# Patient Record
Sex: Female | Born: 1985 | Race: White | Hispanic: No | Marital: Single | Smoking: Never smoker
Health system: Southern US, Community
[De-identification: ages and names within clinical notes are randomized; demographics above are authoritative.]

## PROBLEM LIST (undated history)

## (undated) DIAGNOSIS — G4700 Insomnia, unspecified: Secondary | ICD-10-CM

## (undated) DIAGNOSIS — G629 Polyneuropathy, unspecified: Secondary | ICD-10-CM

## (undated) DIAGNOSIS — F419 Anxiety disorder, unspecified: Secondary | ICD-10-CM

## (undated) DIAGNOSIS — L719 Rosacea, unspecified: Secondary | ICD-10-CM

## (undated) DIAGNOSIS — F909 Attention-deficit hyperactivity disorder, unspecified type: Secondary | ICD-10-CM

## (undated) DIAGNOSIS — T7840XA Allergy, unspecified, initial encounter: Secondary | ICD-10-CM

## (undated) DIAGNOSIS — J45909 Unspecified asthma, uncomplicated: Secondary | ICD-10-CM

## (undated) HISTORY — DX: Anxiety disorder, unspecified: F41.9

## (undated) HISTORY — DX: Rosacea, unspecified: L71.9

## (undated) HISTORY — DX: Allergy, unspecified, initial encounter: T78.40XA

## (undated) HISTORY — DX: Polyneuropathy, unspecified: G62.9

## (undated) HISTORY — PX: WISDOM TOOTH EXTRACTION: SHX21

## (undated) HISTORY — DX: Insomnia, unspecified: G47.00

## (undated) HISTORY — DX: Attention-deficit hyperactivity disorder, unspecified type: F90.9

## (undated) HISTORY — DX: Unspecified asthma, uncomplicated: J45.909

---

## 2019-07-07 ENCOUNTER — Other Ambulatory Visit: Payer: Self-pay

## 2019-07-07 DIAGNOSIS — N6320 Unspecified lump in the left breast, unspecified quadrant: Secondary | ICD-10-CM

## 2019-07-22 ENCOUNTER — Ambulatory Visit
Admission: RE | Admit: 2019-07-22 | Discharge: 2019-07-22 | Disposition: A | Discharge status: Home / Self Care | Payer: No Typology Code available for payment source | Source: Ambulatory Visit | Attending: Obstetrics and Gynecology | Admitting: Obstetrics and Gynecology

## 2019-07-22 ENCOUNTER — Other Ambulatory Visit: Payer: Self-pay

## 2019-07-22 ENCOUNTER — Ambulatory Visit: Payer: Self-pay | Admitting: *Deleted

## 2019-07-22 ENCOUNTER — Other Ambulatory Visit: Payer: Self-pay | Admitting: Obstetrics and Gynecology

## 2019-07-22 VITALS — BP 106/68 | Temp 97.8°F | Wt 137.0 lb

## 2019-07-22 DIAGNOSIS — N6320 Unspecified lump in the left breast, unspecified quadrant: Secondary | ICD-10-CM

## 2019-07-22 DIAGNOSIS — Z1239 Encounter for other screening for malignant neoplasm of breast: Secondary | ICD-10-CM

## 2019-07-22 NOTE — Patient Instructions (Signed)
Explained breast self awareness with Derinda Late. Patient did not need a Pap smear today due to last Pap smear was in June 2020 per patient. Let her know [facility] will cover Pap smears every 3 years unless has a history of abnormal Pap smears. Referred patient to the [facility] for a diagnostic mammogram and left breast ultrasound. Appointment scheduled Tuesday, Jul 22, 2019 at 1410. Patient aware of appointment and will be there. Alanie Syler verbalized understanding.  Luana Tatro, Kathaleen Maser, RN 8:32 PM

## 2019-07-22 NOTE — Progress Notes (Addendum)
[name] is a 34 y.o. female who presents to [facility] clinic today with complaints of a left upper outer breast lump that is moveable that started the size of golf ball and has decreased in size over the past two months. Patient stated the lump began painful and has decreased over the past two weeks. Patient stated the patient was a 5 out of 10.   Pap Smear: Pap not smear completed today. Last Pap smear was June 2020 at [facility] and was normal per patient. Per patient has history of 2-3 abnormal Pap smears in her early 20's that a colposcopy was completed around 15 years ago for follow-up. No Pap smear results are in Epic.   Physical exam: Breasts Breasts symmetrical. No skin abnormalities bilateral breasts. No nipple retraction bilateral breasts. No nipple discharge bilateral breasts. No lymphadenopathy. No lumps palpated right breast. Palpated a pea sized mobile lump within the left breast between 2-3 o'clock. No complaints of pain on exam.       Pelvic/Bimanual Pap is not indicated today per [facility] guidelines.   Smoking History: Patient has never smoked.   Patient Navigation: Patient education provided. Access to services provided for patient through [facility] program.    Breast and Cervical Cancer Risk Assessment: Patient has a family history of her paternal grandmother having breast cancer. Patient has no known genetic mutations or history of radiation treatment to the chest before age 54. Per patient has a history of cervical dysplasia. Patient has no history of being immunocompromised or DES exposure in-utero.  Risk Assessment    Risk Scores      07/22/2019   Last edited by: Desiree Rutherford, LPN   5-year risk: 0.4 %   Lifetime risk: 12.7 %          A: [facility] exam without pap smear  P: Referred patient to the [facility] for a diagnostic mammogram and left breast ultrasound. Appointment scheduled Tuesday, Jul 22, 2019 at  1410.  Desiree Heidelberg, RN 07/22/2019 9:51 PM

## 2021-03-12 IMAGING — MG DIGITAL DIAGNOSTIC BILAT W/ TOMO W/ CAD
6 of 10 series · 6 of 30 positions shown · non-contrast
Comparison: None.

CLINICAL DATA: 33-year-old who noticed a palpable lump in the UPPER
OUTER periareolar LEFT breast approximately 2 months ago at the time
of her second ISPFE-RY vaccine, associated with focal tenderness at
that time. The palpable lump and tenderness have subsequently
resolved. However, on clinical examination earlier today there was a
possible pea-sized palpable lump in that same location.

This is the patient's initial baseline mammogram. Family history of
breast cancer in her paternal grandmother.
EXAM:
DIGITAL DIAGNOSTIC BILATERAL MAMMOGRAM WITH CAD AND TOMO
ULTRASOUND LEFT BREAST

[R MLO synth-2D]
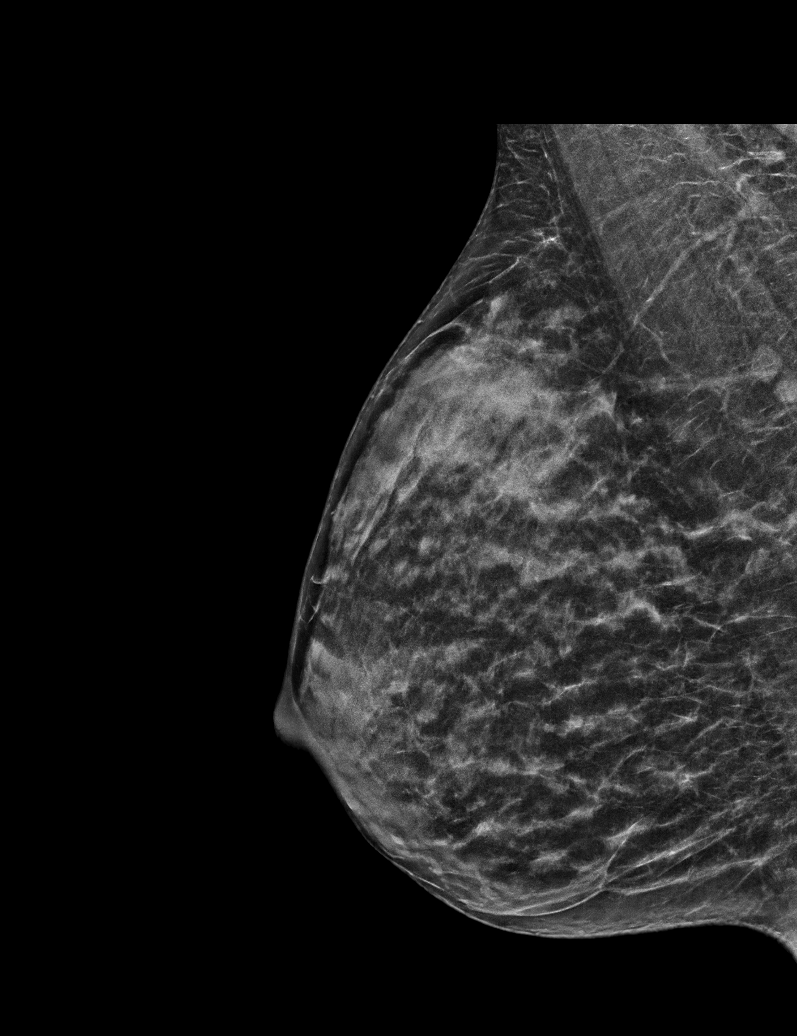

[L MLO synth-2D]
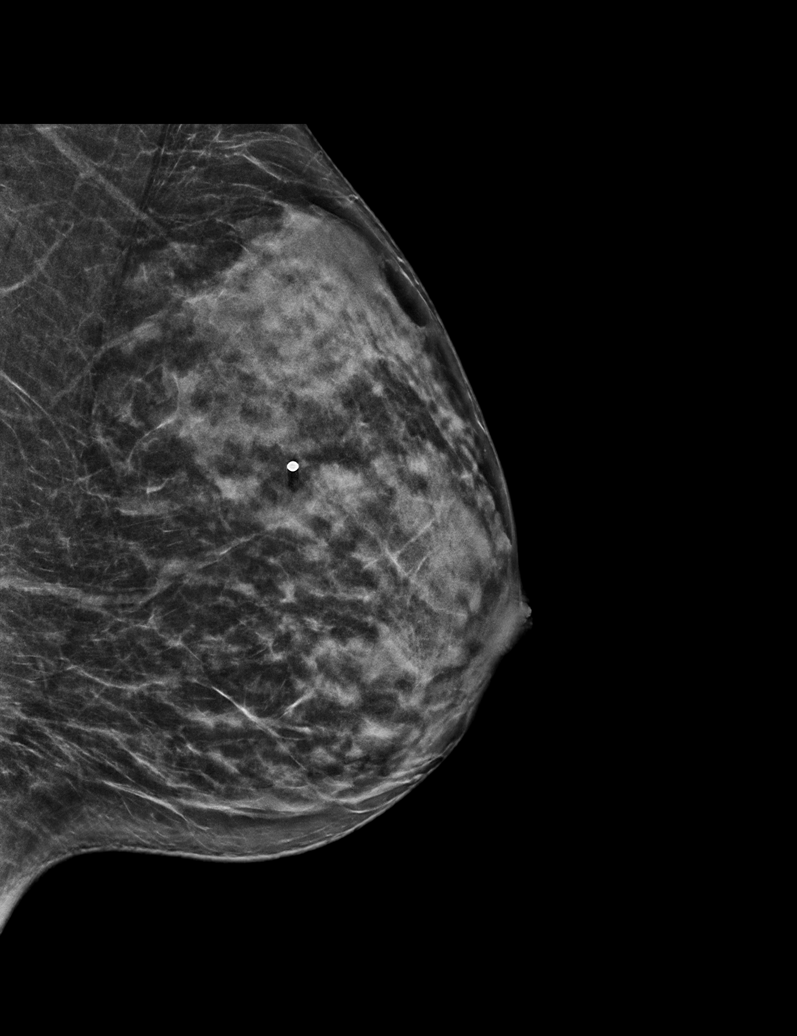

[L CC synth-2D]
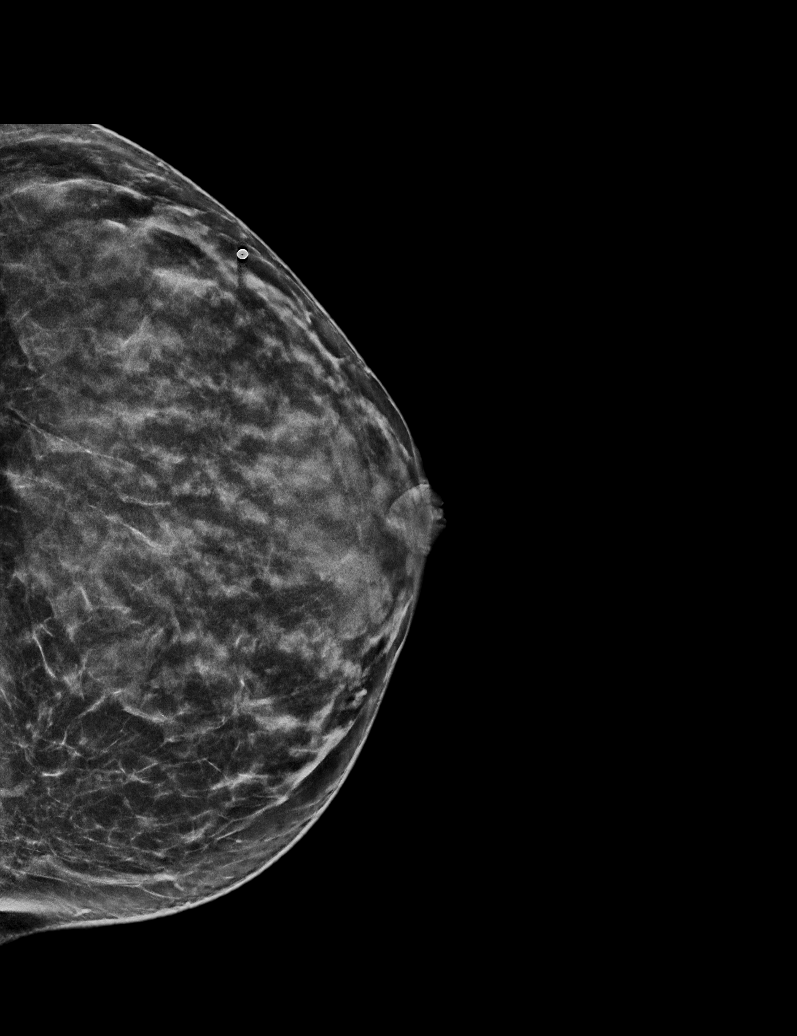

[L TAN synth-2D]
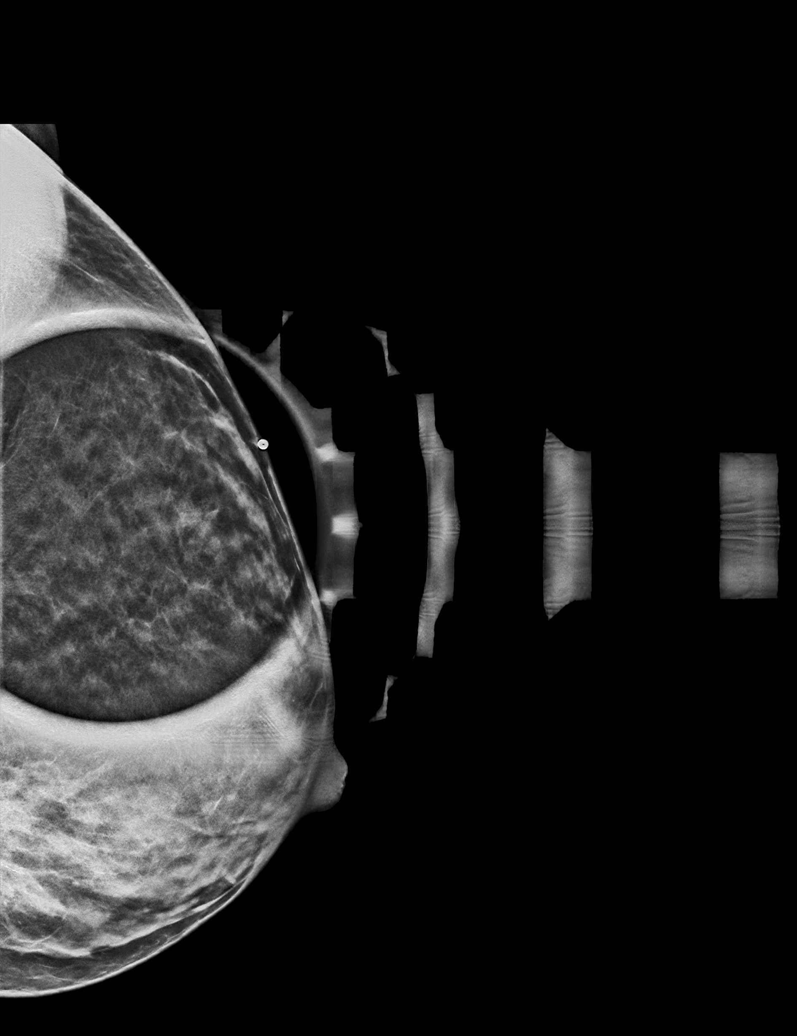

[R CC synth-2D]
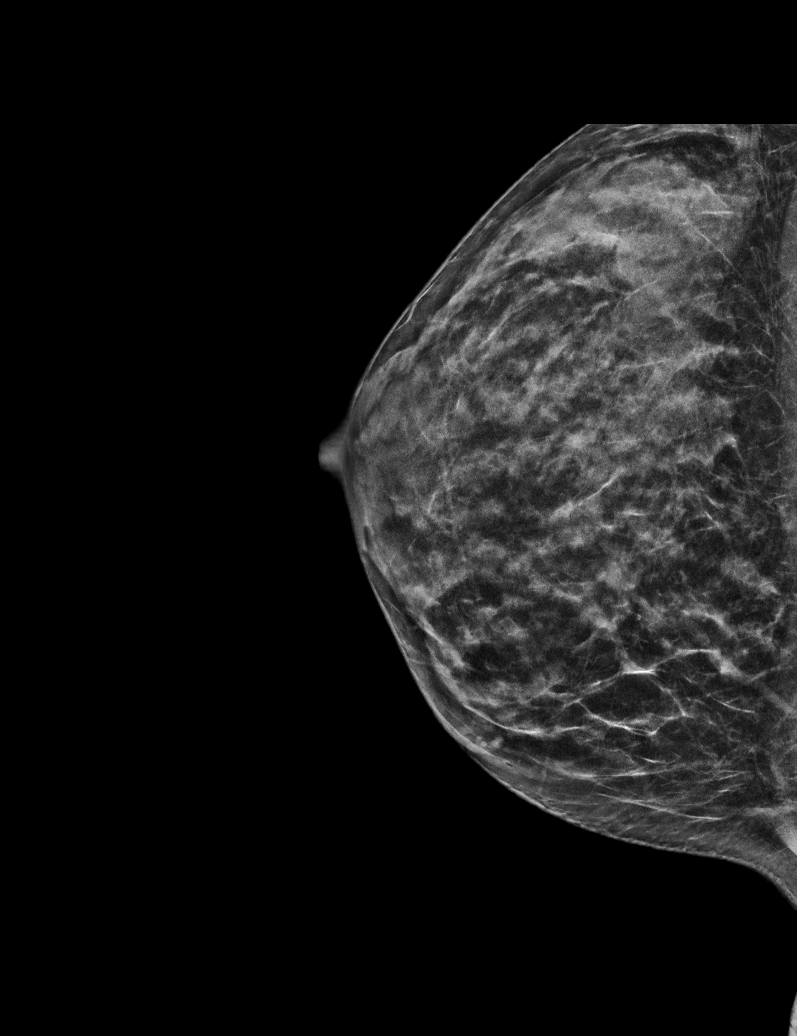

[R MLO tomo · tomo slice 27/52.0]
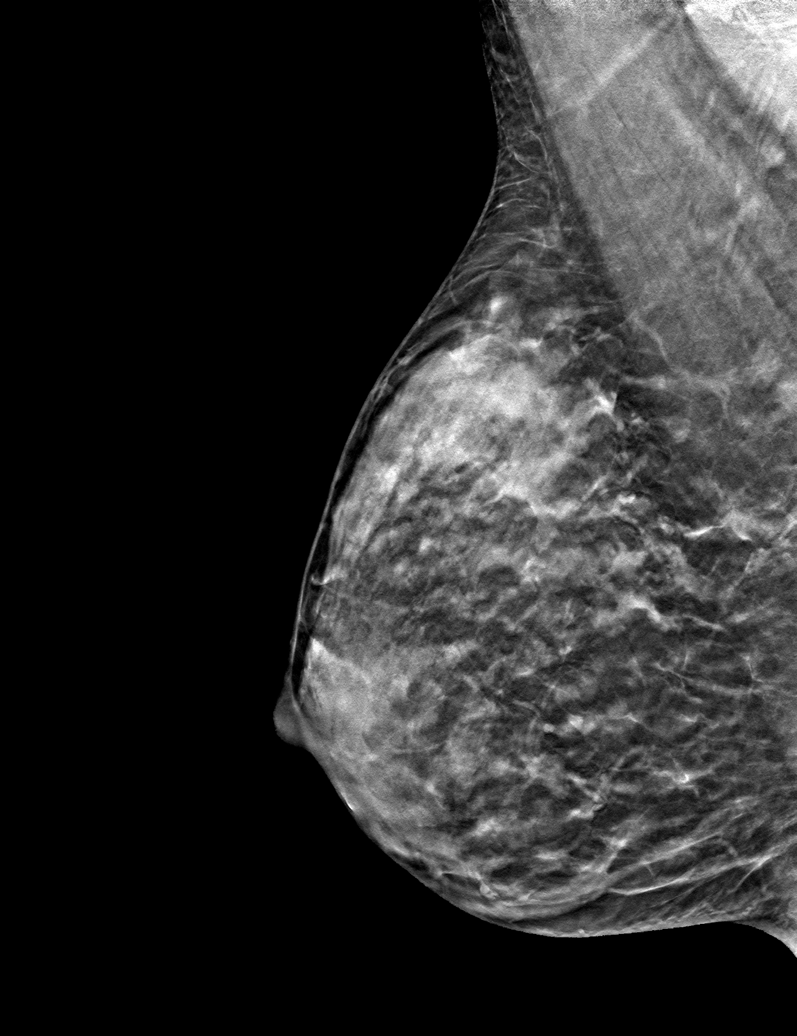

[6 of 30 positions shown; findings below may reference images not displayed]

ACR Breast Density Category c: The breast tissue is heterogeneously
dense, which may obscure small masses.
FINDINGS: Tomosynthesis and synthesized full field CC and MLO views of both
breasts were obtained. Tomosynthesis and synthesized spot
compression tangential view of the area of concern in the LEFT
breast was also obtained.

No mammographic abnormality in the area of palpable concern in the
OUTER periareolar LEFT breast. Dense fibroglandular tissue extends
very close to the skin surface in this location.

No findings suspicious for malignancy in either breast.

Mammographic images were processed with CAD.

On correlative physical exam, the tissues of the UPPER OUTER LEFT
breast have a "lumpy bumpy" texture, though I do not palpate a
discrete mass.

Targeted LEFT breast ultrasound is performed, showing normal dense
fibroglandular tissue in the UPPER OUTER QUADRANT at the 2-3 o'clock
position in the area of palpable concern. No cyst, solid mass or
abnormal acoustic shadowing is identified.
IMPRESSION: 1. No mammographic or sonographic evidence of malignancy involving
the LEFT breast.
2. No mammographic evidence of malignancy involving the RIGHT
breast.

RECOMMENDATION:
Screening mammogram at age 38 unless there are persistent or
subsequent clinical concerns. (Code:[id number]-[id number])

I have discussed the findings and recommendations with the patient.
If applicable, a reminder letter will be sent to the patient
regarding the next appointment.

BI-RADS CATEGORY  1: Negative.

## 2021-10-11 LAB — RESULTS CONSOLE HPV: CHL HPV: NEGATIVE

## 2021-10-11 LAB — HM PAP SMEAR: HM Pap smear: NORMAL

## 2022-01-20 LAB — HM HEPATITIS C SCREENING LAB: HM Hepatitis Screen: NEGATIVE

## 2022-01-20 LAB — HM HIV SCREENING LAB: HM HIV Screening: NEGATIVE

## 2022-06-30 ENCOUNTER — Ambulatory Visit: Payer: BC Managed Care – PPO | Admitting: Nurse Practitioner

## 2022-06-30 ENCOUNTER — Encounter: Payer: Self-pay | Admitting: Nurse Practitioner

## 2022-06-30 VITALS — BP 122/80 | HR 76 | Temp 97.6°F | Ht 66.0 in | Wt 126.4 lb

## 2022-06-30 DIAGNOSIS — G4700 Insomnia, unspecified: Secondary | ICD-10-CM | POA: Insufficient documentation

## 2022-06-30 DIAGNOSIS — F32A Depression, unspecified: Secondary | ICD-10-CM

## 2022-06-30 DIAGNOSIS — F909 Attention-deficit hyperactivity disorder, unspecified type: Secondary | ICD-10-CM

## 2022-06-30 DIAGNOSIS — Z79899 Other long term (current) drug therapy: Secondary | ICD-10-CM

## 2022-06-30 DIAGNOSIS — F419 Anxiety disorder, unspecified: Secondary | ICD-10-CM | POA: Diagnosis not present

## 2022-06-30 DIAGNOSIS — L719 Rosacea, unspecified: Secondary | ICD-10-CM

## 2022-06-30 DIAGNOSIS — G629 Polyneuropathy, unspecified: Secondary | ICD-10-CM

## 2022-06-30 DIAGNOSIS — J4520 Mild intermittent asthma, uncomplicated: Secondary | ICD-10-CM

## 2022-06-30 MED ORDER — AMPHETAMINE-DEXTROAMPHET ER 20 MG PO CP24: 20.0000 mg | ORAL_CAPSULE | Freq: Every day | ORAL | 0 refills | Status: DC
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
## 2022-06-30 NOTE — Patient Instructions (Addendum)
It was great to see you!  I have refilled your adderall.   I have placed a referral to a therapist, they will call to schedule   Look into Duloxetine (Cymbalta) this can help with anxiety and nerve pain   Let's follow-up in 3 months, sooner if you have concerns.  If a referral was placed today, you will be contacted for an appointment. Please note that routine referrals can sometimes take up to 3-4 weeks to process. Please call our office if you haven't heard anything after this time frame.  Take care,  Rodman Pickle, NP

## 2022-06-30 NOTE — Assessment & Plan Note (Signed)
Chronic, stable.  She is currently following with dermatology for this.  Continue her regimen of doxycycline and oxymetazolone/ivermectin cream

## 2022-06-30 NOTE — Assessment & Plan Note (Signed)
Chronic, stable.  Continue albuterol inhaler every 6 hours as needed.

## 2022-06-30 NOTE — Assessment & Plan Note (Signed)
Chronic, stable.  She is doing well on Adderall XR 20 mg daily.  She states that this is really helping control her symptoms and focus at work.  PDMP reviewed.  Will continue this regimen.  Refill sent to the pharmacy.  Follow-up in 3 months.
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
## 2022-06-30 NOTE — Assessment & Plan Note (Signed)
Controlled substance agreement signed for Xanax 0.5 mg daily as needed, Ambien 10 mg daily at bedtime as needed, and Adderall XR 20 mg daily.

## 2022-06-30 NOTE — Assessment & Plan Note (Signed)
Chronic, stable.  Continue Ambien 10 mg at bedtime as needed.  PDMP reviewed.  She does not need a refill at this time.

## 2022-06-30 NOTE — Assessment & Plan Note (Signed)
Chronic, not controlled.  She is having ongoing anxiety.  She is following with a therapist, however she feels like she would benefit from a different one.  Referral placed to a different therapist.  Discussed starting everyday medication to help with symptoms, she is very hesitant with this.  Did discuss Cymbalta as this can help with neuropathy as well.  Will have her continue Xanax 0.5 mg daily as needed.  PDMP reviewed.  Follow-up in 3 months.

## 2022-09-06 ENCOUNTER — Telehealth: Payer: Self-pay | Admitting: Nurse Practitioner
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
## 2022-09-06 NOTE — Telephone Encounter (Signed)
That is okay with me 

## 2022-09-19 NOTE — Telephone Encounter (Signed)
Hey Lauren I just wanted to bring this to your attention. It was sent while you were out.

## 2022-09-29 ENCOUNTER — Encounter: Payer: Self-pay | Admitting: Internal Medicine

## 2022-09-29 ENCOUNTER — Ambulatory Visit (INDEPENDENT_AMBULATORY_CARE_PROVIDER_SITE_OTHER): Payer: BC Managed Care – PPO | Admitting: Internal Medicine

## 2022-09-29 ENCOUNTER — Ambulatory Visit: Payer: BC Managed Care – PPO | Admitting: Nurse Practitioner

## 2022-09-29 VITALS — BP 120/70 | HR 99 | Temp 98.3°F | Ht 65.0 in | Wt 137.4 lb

## 2022-09-29 DIAGNOSIS — F902 Attention-deficit hyperactivity disorder, combined type: Secondary | ICD-10-CM

## 2022-09-29 DIAGNOSIS — F419 Anxiety disorder, unspecified: Secondary | ICD-10-CM | POA: Diagnosis not present

## 2022-09-29 DIAGNOSIS — R202 Paresthesia of skin: Secondary | ICD-10-CM

## 2022-09-29 NOTE — Addendum Note (Signed)
Addended by: Waymond Cera on: 09/29/2022 09:31 AM   Modules accepted: Orders

## 2022-09-29 NOTE — Progress Notes (Signed)
[facility] [address] [place] [address] Dept: [phone] Dept Fax: [phone]    Subjective:   Desiree Wallace 1985-07-18 09/29/2022  Chief Complaint  Patient presents with   ADHD    HPI: Desiree Wallace presents today for follow up.  ADHD FOLLOW UP Patient presents for the medical management of ADD/ADHD.  Current medication regimen: Adderall XR 20mg   ADHD status: stable ADHD Medication Side Effects: no  Controlled substance contract: yes     ANXIETY: Elmire Amrein presents for the medical management of anxiety.  Current medication regimen: Xanax 0.5mg  PRN, patient only uses this sparingly Counseling: yes, she feels that her new counselor is helping with different coping strategies.  Well controlled: yes Denies SI/HI.    PARESTHESIA: Is followed by neurology.  Recently started on Duloxetine 60mg .  Is doing PT rehab.  States some days tingling and numbness are better than other days.    The following portions of the patient's history were reviewed and updated as appropriate: past medical history, past surgical history, family history, social history, allergies, medications, and problem list.   Patient Active Problem List   Diagnosis Date Noted   Anxiety and depression 06/30/2022   Attention deficit hyperactivity disorder (ADHD) 06/30/2022   Neuropathy 06/30/2022   Insomnia 06/30/2022   Mild intermittent asthma without complication 06/30/2022   Rosacea 06/30/2022   Controlled substance agreement signed 06/30/2022   Past Medical History:  Diagnosis Date   ADHD    Allergy    Anxiety    Asthma    Insomnia    Neuropathy    Rosacea    Past Surgical History:  Procedure Laterality Date   WISDOM TOOTH EXTRACTION     Family History  Problem Relation Age of Onset   Diabetes Mother    Hypertension Mother    Heart failure Mother    Asthma Brother    Cancer Paternal Grandmother         breast   Atrial fibrillation Paternal Grandmother    Thyroid disease Paternal Grandmother    Cancer Paternal Grandfather        colon   Outpatient Medications Prior to Visit  Medication Sig Dispense Refill   albuterol (VENTOLIN HFA) 108 (90 Base) MCG/ACT inhaler Inhale 1-2 puffs into the lungs every 6 (six) hours as needed for wheezing or shortness of breath.     ALPRAZolam (XANAX) 0.5 MG tablet Take 0.5 mg by mouth at bedtime as needed.     amphetamine-dextroamphetamine (ADDERALL XR) 20 MG 24 hr capsule Take 1 capsule (20 mg total) by mouth daily. 30 capsule 0   doxycycline (VIBRAMYCIN) 100 MG capsule TAKE 1 CAPSULE BY MOUTH TWICE DAILY WITH A FULL GLASS OF WATER     levonorgestrel (MIRENA) 20 MCG/DAY IUD by Intrauterine route.     zolpidem (AMBIEN) 10 MG tablet Take 10 mg by mouth at bedtime as needed.     No facility-administered medications prior to visit.   Allergies  Allergen Reactions   Minocycline Itching     ROS: A complete ROS was performed with pertinent positives/negatives noted in the HPI. The remainder of the ROS are negative.    Objective:   Today's Vitals   09/29/22 0845  BP: 120/70  Pulse: 99  Temp: 98.3 F (36.8 C)  SpO2: 97%  Weight: 137 lb 6.4 oz (62.3 kg)  Height: 5\' 5"  (1.651 m)   GENERAL: Well-appearing, in NAD. Well nourished.  SKIN: Pink, warm and dry.  NECK:  Trachea midline. Full ROM w/o pain or tenderness. No lymphadenopathy.  RESPIRATORY: Chest wall symmetrical. Respirations even and non-labored. Breath sounds clear to auscultation bilaterally.  CARDIAC: S1, S2 present, regular rate and rhythm. Peripheral pulses 2+ bilaterally.  EXTREMITIES: Without clubbing, cyanosis, or edema.  NEUROLOGIC:  Steady, even gait.  PSYCH/MENTAL STATUS: Alert, oriented x 3. Cooperative, appropriate mood and affect.   Health Maintenance Due  Topic Date Due   COVID-19 Vaccine (9 - 2023-24 season) 01/24/2022    No results found for any visits on  09/29/22.  The ASCVD Risk score (Arnett DK, et al., 2019) failed to calculate for the following reasons:   The 2019 ASCVD risk score is only valid for ages 88 to 50     Assessment & Plan:    Return in about 6 months (around 04/01/2023) for Fasting Annual Physical Exam.   Of note, portions of this note may have been created with voice recognition software [employer], medical). While this note has been edited for accuracy, occasional wrong-word or 'sound-a-like' substitutions may have occurred due to the inherent limitations of voice recognition software.  Desiree Decent, FNP

## 2022-10-06 ENCOUNTER — Telehealth: Payer: Self-pay | Admitting: Nurse Practitioner

## 2022-10-06 NOTE — Telephone Encounter (Signed)
Prescription Request  10/06/2022  LOV: 06/30/2022  What is the name of the medication or equipment? mphetamine-dextroamphetamine (ADDERALL XR) 20 MG 24 hr capsule [[id number]]   Have you contacted your pharmacy to request a refill? No   Which pharmacy would you like this sent to?   [facility] -[address] [place] [address] [phone] )  Patient notified that their request is being sent to the clinical staff for review and that they should receive a response within 2 business days.   Please advise at Mobile [phone] (mobile)

## 2022-10-09 ENCOUNTER — Other Ambulatory Visit: Payer: Self-pay | Admitting: Internal Medicine

## 2022-10-09 DIAGNOSIS — F902 Attention-deficit hyperactivity disorder, combined type: Secondary | ICD-10-CM

## 2022-10-09 MED ORDER — AMPHETAMINE-DEXTROAMPHET ER 20 MG PO CP24
20.0000 mg | ORAL_CAPSULE | Freq: Every day | ORAL | 0 refills | Status: DC
Start: 2022-10-09 — End: 2022-11-22

## 2022-11-21 ENCOUNTER — Telehealth: Payer: Self-pay | Admitting: Internal Medicine

## 2022-11-21 NOTE — Telephone Encounter (Signed)
Please advise 

## 2022-11-21 NOTE — Telephone Encounter (Signed)
11/20/22 - Sam, from [facility] called asking for approval Auth on the medication amphetamine-dextroamphetamine (ADDERALL XR) 20 MG 24 hr capsule [[id number]] for the pt. He said original request was sent in on Nov 09, 2022 and they are yet to get a response back. He wants a call back at [phone].

## 2022-11-21 NOTE — Telephone Encounter (Signed)
Yes approval to fill medication. Does he need a new Rx sent?

## 2022-11-22 ENCOUNTER — Other Ambulatory Visit: Payer: Self-pay | Admitting: Internal Medicine

## 2022-11-22 DIAGNOSIS — F902 Attention-deficit hyperactivity disorder, combined type: Secondary | ICD-10-CM

## 2022-11-22 MED ORDER — AMPHETAMINE-DEXTROAMPHET ER 20 MG PO CP24
20.0000 mg | ORAL_CAPSULE | Freq: Every day | ORAL | 0 refills | Status: DC
Start: 2022-11-22 — End: 2022-12-17

## 2022-11-22 NOTE — Telephone Encounter (Signed)
New Rx is needed for refill amphetamine-dextroamphetamine (ADDERALL XR) 20 MG 24 hr capsule

## 2022-11-22 NOTE — Telephone Encounter (Signed)
Rx sent in

## 2022-12-15 ENCOUNTER — Other Ambulatory Visit: Payer: Self-pay | Admitting: Internal Medicine

## 2022-12-15 DIAGNOSIS — F902 Attention-deficit hyperactivity disorder, combined type: Secondary | ICD-10-CM

## 2023-01-17 ENCOUNTER — Other Ambulatory Visit: Payer: Self-pay | Admitting: Internal Medicine

## 2023-01-17 ENCOUNTER — Telehealth: Payer: Self-pay | Admitting: Internal Medicine

## 2023-01-17 DIAGNOSIS — F902 Attention-deficit hyperactivity disorder, combined type: Secondary | ICD-10-CM

## 2023-01-17 NOTE — Telephone Encounter (Signed)
Prescription Request  01/17/2023  LOV: 09/29/2022  What is the name of the medication or equipment? amphetamine-dextroamphetamine (ADDERALL XR) 20 MG 24 hr capsule [[id number]]   Have you contacted your pharmacy to request a refill? Yes   Which pharmacy would you like this sent to?   [facility] [place], [place] - [address] [place] [address] Phone: [phone] Fax: [phone]    Patient notified that their request is being sent to the clinical staff for review and that they should receive a response within 2 business days.   Please advise at Mobile [phone] (mobile)

## 2023-01-17 NOTE — Telephone Encounter (Signed)
Rx request sent to PCP. 

## 2023-01-17 NOTE — Telephone Encounter (Signed)
Last Ov 09/29/22 Filled 12/17/22

## 2023-02-26 ENCOUNTER — Other Ambulatory Visit: Payer: Self-pay | Admitting: Internal Medicine

## 2023-02-26 DIAGNOSIS — F902 Attention-deficit hyperactivity disorder, combined type: Secondary | ICD-10-CM

## 2023-02-26 NOTE — Telephone Encounter (Signed)
Last Ov 09/29/22 Filled 01/17/23

## 2023-02-27 ENCOUNTER — Ambulatory Visit: Payer: BC Managed Care – PPO | Admitting: Internal Medicine

## 2023-02-27 ENCOUNTER — Encounter: Payer: Self-pay | Admitting: Internal Medicine

## 2023-02-27 VITALS — BP 110/66 | HR 92 | Temp 98.3°F | Ht 65.0 in | Wt 148.6 lb

## 2023-02-27 DIAGNOSIS — J029 Acute pharyngitis, unspecified: Secondary | ICD-10-CM | POA: Diagnosis not present

## 2023-02-27 LAB — POCT INFLUENZA A/B
Influenza A, POC: NEGATIVE
Influenza B, POC: NEGATIVE

## 2023-02-27 LAB — POC COVID19 BINAXNOW: SARS Coronavirus 2 Ag: NEGATIVE

## 2023-02-27 LAB — POCT RAPID STREP A (OFFICE): Rapid Strep A Screen: NEGATIVE

## 2023-02-27 MED ORDER — PENICILLIN V POTASSIUM 500 MG PO TABS
500.0000 mg | ORAL_TABLET | Freq: Two times a day (BID) | ORAL | 0 refills | Status: AC
Start: 2023-02-27 — End: 2023-03-09

## 2023-02-27 NOTE — Progress Notes (Signed)
[facility] [address] [place] [address] Dept: [phone] Dept Fax: [phone]  Acute Care Office Visit  Subjective:   Desiree Wallace December 10, 1985 02/27/2023  Chief Complaint  Patient presents with   Headache    Started on Sunday    Sore Throat    Started yesterday     HPI: Discussed the use of AI scribe software for clinical note transcription with the patient, who gave verbal consent to proceed.  History of Present Illness   The patient presents complaining of a severe headache that started on Sunday. The headache persisted throughout the day, despite taking goody powder later. The following day, they noticed their son had nasal congestion and was sneezing. They themselves did not have any nasal congestion, but their headache returned. They also started experiencing feverish chills, alternating between feeling hot and cold, and sore throat onset yesterday.Their throat began hurting severely, particularly noticeable upon waking up this morning. 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        The following portions of the patient's history were reviewed and updated as appropriate: past medical history, past surgical history, family history, social history, allergies, medications, and problem list.   Patient Active Problem List   Diagnosis Date Noted   Anxiety and depression 06/30/2022   Attention deficit hyperactivity disorder (ADHD) 06/30/2022   Neuropathy 06/30/2022   Insomnia 06/30/2022   Mild intermittent asthma without complication 06/30/2022   Rosacea 06/30/2022   Controlled substance agreement signed 06/30/2022   Past Medical History:  Diagnosis Date   ADHD    Allergy    Anxiety    Asthma    Insomnia    Neuropathy    Rosacea    Past Surgical History:  Procedure Laterality Date   WISDOM TOOTH EXTRACTION     Family History   Problem Relation Age of Onset   Diabetes Mother    Hypertension Mother    Heart failure Mother    Asthma Brother    Cancer Paternal Grandmother        breast   Atrial fibrillation Paternal Grandmother    Thyroid disease Paternal Grandmother    Cancer Paternal Grandfather        colon    Current Outpatient Medications:    albuterol (VENTOLIN HFA) 108 (90 Base) MCG/ACT inhaler, Inhale 1-2 puffs into the lungs every 6 (six) hours as needed for wheezing or shortness of breath., Disp: , Rfl:    ALPRAZolam (XANAX) 0.5 MG tablet, Take 0.5 mg by mouth at bedtime as needed., Disp: , Rfl:    amphetamine-dextroamphetamine (ADDERALL XR) 20 MG 24 hr capsule, TAKE 1 CAPSULE (20 MG TOTAL) BY MOUTH DAILY., Disp: 30 capsule, Rfl: 0   DULoxetine (CYMBALTA) 60 MG capsule, Take 60 mg by mouth daily., Disp: , Rfl:    levonorgestrel (MIRENA) 20 MCG/DAY IUD, by Intrauterine route., Disp: , Rfl:    penicillin v potassium (VEETID) 500 MG tablet, Take 1 tablet (500 mg total) by mouth 2 (two) times daily for 10 days., Disp: 20 tablet, Rfl: 0   zolpidem (AMBIEN) 10 MG tablet, Take 10 mg by mouth at bedtime as needed., Disp: , Rfl:  Allergies  Allergen Reactions   Minocycline Itching     ROS: A complete ROS was performed with pertinent positives/negatives noted in the HPI. The remainder of the ROS are negative.    Objective:   Today's Vitals  02/27/23 1026  BP: 110/66  Pulse: 92  Temp: 98.3 F (36.8 C)  TempSrc: Temporal  SpO2: 98%  Weight: 148 lb 9.6 oz (67.4 kg)  Height: 5\' 5"  (1.651 m)    GENERAL: ill-appearing, nontoxic, in NAD. Well nourished.  SKIN: Pink, warm and dry. No rash.  HEENT:    HEAD: Normocephalic, non-traumatic.  EYES: Conjunctive pink without exudate. EARS: External ear w/o redness, swelling, masses, or lesions. EAC clear. TM's intact, translucent w/o bulging, appropriate landmarks visualized.  NOSE: Septum midline w/o deformity. Nares patent, mucosa pink and non-inflamed  w/o drainage. No sinus tenderness.  THROAT: Uvula midline. Oropharynx erythematous. Tonsils inflamed with white exudate on right. Mucus membranes pink and moist.  NECK: Trachea midline. Full ROM w/o pain or tenderness. No lymphadenopathy.  RESPIRATORY: Chest wall symmetrical. Respirations even and non-labored. Breath sounds clear to auscultation bilaterally.  CARDIAC: S1, S2 present, regular rate and rhythm. Peripheral pulses 2+ bilaterally.  EXTREMITIES: Without clubbing, cyanosis, or edema.  NEUROLOGIC: Steady, even gait.  PSYCH/MENTAL STATUS: Alert, oriented x 3. Cooperative, appropriate mood and affect.    Results for orders placed or performed in visit on 02/27/23  POCT Influenza A/B  Result Value Ref Range   Influenza A, POC Negative Negative   Influenza B, POC Negative Negative  POCT rapid strep A  Result Value Ref Range   Rapid Strep A Screen Negative Negative  POC COVID-19 BinaxNow  Result Value Ref Range   SARS Coronavirus 2 Ag Negative Negative      Assessment & Plan:  Assessment and Plan    Pharyngitis Sudden onset of severe sore throat, headache, and feverish chills. Large white patch on right tonsil. Rapid strep test negative, but clinical suspicion for strep throat remains high due to symptoms and physical exam findings. -Start Penicillin VK 500mg  BID for 10 days. -Advise use of Chloraseptic spray, warm salt water gargles, and Tylenol or ibuprofen as needed for throat pain and aches. -Obtain throat culture to rule out Group A Strep. -Patient to follow up if symptoms worsen or fail to improve.      Meds ordered this encounter  Medications   penicillin v potassium (VEETID) 500 MG tablet    Sig: Take 1 tablet (500 mg total) by mouth 2 (two) times daily for 10 days.    Dispense:  20 tablet    Refill:  0    Supervising Provider:   Garnette Gunner [[id number]]   Orders Placed This Encounter  Procedures   Culture, Group A Strep    Source:   throat   POCT Influenza  A/B   POCT rapid strep A   POC COVID-19 BinaxNow    Previously tested for COVID-19:   No    Resident in a congregate (group) care setting:   No    Employed in healthcare setting:   No    Pregnant:   No   Lab Orders         Culture, Group A Strep         POCT Influenza A/B         POCT rapid strep A         POC COVID-19 BinaxNow     No images are attached to the encounter or orders placed in the encounter.  Return if symptoms worsen or fail to improve.   Salvatore Decent, FNP

## 2023-02-27 NOTE — Patient Instructions (Addendum)
Rapid strep test negative, throat culture sent to lab Ibuprofen every 6 hours, Tylenol every 4 hours as needed for fevers/pain Drink plenty of water and fluids Warm salt water gargles and/or hot tea with honey to help sooth Humidifier when sleeping Chloraseptic spray for sore throat  Throat lozenges

## 2023-03-01 LAB — CULTURE, GROUP A STREP
Micro Number: 15860724
SPECIMEN QUALITY:: ADEQUATE

## 2023-03-15 ENCOUNTER — Other Ambulatory Visit: Payer: Self-pay

## 2023-03-15 MED ORDER — ZOLPIDEM TARTRATE 10 MG PO TABS: 10.0000 mg | ORAL_TABLET | Freq: Every evening | ORAL | 2 refills | Status: DC | PRN

## 2023-03-15 NOTE — Telephone Encounter (Signed)
 Patient requesting Rx refill Last Ov 02/27/23

## 2023-03-26 ENCOUNTER — Ambulatory Visit: Payer: Self-pay | Admitting: Internal Medicine

## 2023-03-26 ENCOUNTER — Ambulatory Visit (INDEPENDENT_AMBULATORY_CARE_PROVIDER_SITE_OTHER): Payer: 59 | Admitting: Family Medicine

## 2023-03-26 VITALS — BP 112/74 | HR 80 | Temp 97.1°F | Wt 152.0 lb

## 2023-03-26 DIAGNOSIS — W009XXA Unspecified fall due to ice and snow, initial encounter: Secondary | ICD-10-CM

## 2023-03-26 DIAGNOSIS — M545 Low back pain, unspecified: Secondary | ICD-10-CM | POA: Insufficient documentation

## 2023-03-26 MED ORDER — IBUPROFEN 600 MG PO TABS: 600.0000 mg | ORAL_TABLET | Freq: Three times a day (TID) | ORAL | 0 refills | Status: DC | PRN

## 2023-03-26 NOTE — Assessment & Plan Note (Addendum)
 Prescribe ibuprofen  600 mg to be taken every 68 hours with food as needed for pain. Advise the use of cyclobenzaprine (Flexeril) as needed for muscle spasms. Continue using Tylenol as needed for additional pain relief.Rest and limit strenuous activities that may exacerbate back pain. Apply ice or heat to the affected area as preferred for comfort. Begin gentle back stretches and exercises as tolerated. Monitor for any new or worsening symptoms, such as increased pain, numbness, or weakness. If symptoms do not improve or worsen over the next couple of weeks, return to the clinic for reassessment and consider imaging studies. Continue care with neurologist for chronic back pain and neuropathic symptoms. Exercise caution on icy surfaces to prevent future falls.

## 2023-03-26 NOTE — Telephone Encounter (Signed)
   Chief Complaint: fell Symptoms: sore back Frequency: constant  Disposition: [] ED /[] Urgent Care (no appt availability in office) / [x] Appointment(In office/virtual)/ []  [facility]/ [] Home Care/ [] Refused Recommended Disposition /[] [facility]/ []  Follow-up with PCP Additional Notes: Pt fell on ice yesterday while taking out the trash. Pt stated she feels that she didn't hit her head. Pt had tiny cuts on hands,with minimal bleeding,  but can't see them now. Pt stated she has anxiety and feels that made it worse due to worrying if she really hurt herself. Pt stated she had a very mild headache yesterday. Pain is 3/10. Per protocol, pt needs to be seen today. Appt for 1100. Care advice given and pt verbalized understanding.              Copied from CRM [id number]. Topic: Clinical - Red Word Triage >> Mar 26, 2023  8:37 AM Deaijah H wrote: Red Word that prompted transfer to Nurse Triage: [place] (hit back really bad) Reason for Disposition  [1] No prior tetanus shots (or is not fully vaccinated) AND [2] any wound (e.g., cut or scrape)  Answer Assessment - Initial Assessment Questions 1. MECHANISM: How did the fall happen?     Taking out the trash and fell on ice 2. DOMESTIC VIOLENCE AND ELDER ABUSE SCREENING: Did you fall because someone pushed you or tried to hurt you? If Yes, ask: Are you safe now?     na 3. ONSET: When did the fall happen? (e.g., minutes, hours, or days ago)     Yesterday  4. LOCATION: What part of the body hit the ground? (e.g., back, buttocks, head, hips, knees, hands, head, stomach)     back 5. INJURY: Did you hurt (injure) yourself when you fell? If Yes, ask: What did you injure? Tell me more about this? (e.g., body area; type of injury; pain severity)     Anxiety attack it worse. Mild headache  6. PAIN: Is there any pain? If Yes, ask: How bad is the pain? (e.g., Scale 1-10; or mild,  moderate, severe)   - NONE (0): No  pain   - MILD (1-3): Doesn't interfere with normal activities    - MODERATE (4-7): Interferes with normal activities or awakens from sleep    - SEVERE (8-10): Excruciating pain, unable to do any normal activities      3 7. SIZE: For cuts, bruises, or swelling, ask: How large is it? (e.g., inches or centimeters)     Tiny little cuts on hands. Small amount of blood 8. PREGNANCY: Is there any chance you are pregnant? When was your last menstrual period?     na 9. OTHER SYMPTOMS: Do you have any other symptoms? (e.g., dizziness, fever, weakness; new onset or worsening).      Mild Headache right after fall 10. CAUSE: What do you think caused the fall (or falling)? (e.g., tripped, dizzy spell)       Ice on ground  Protocols used: Falls and [facility]
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
## 2023-04-04 ENCOUNTER — Encounter: Payer: BC Managed Care – PPO | Admitting: Nurse Practitioner

## 2023-04-05 ENCOUNTER — Other Ambulatory Visit: Payer: Self-pay | Admitting: Medical Genetics

## 2023-04-19 ENCOUNTER — Other Ambulatory Visit (HOSPITAL_COMMUNITY)
Admission: RE | Admit: 2023-04-19 | Discharge: 2023-04-19 | Disposition: A | Discharge status: Home / Self Care | Payer: Self-pay | Source: Ambulatory Visit | Attending: Medical Genetics | Admitting: Medical Genetics

## 2023-05-01 ENCOUNTER — Ambulatory Visit (INDEPENDENT_AMBULATORY_CARE_PROVIDER_SITE_OTHER): Payer: 59 | Admitting: Internal Medicine

## 2023-05-01 ENCOUNTER — Encounter: Payer: Self-pay | Admitting: Internal Medicine

## 2023-05-01 VITALS — BP 120/80 | HR 98 | Temp 98.1°F | Ht 65.0 in | Wt 153.0 lb

## 2023-05-01 DIAGNOSIS — L719 Rosacea, unspecified: Secondary | ICD-10-CM

## 2023-05-01 DIAGNOSIS — R202 Paresthesia of skin: Secondary | ICD-10-CM | POA: Diagnosis not present

## 2023-05-01 DIAGNOSIS — R635 Abnormal weight gain: Secondary | ICD-10-CM

## 2023-05-01 DIAGNOSIS — Z833 Family history of diabetes mellitus: Secondary | ICD-10-CM | POA: Diagnosis not present

## 2023-05-01 LAB — POCT GLYCOSYLATED HEMOGLOBIN (HGB A1C): Hemoglobin A1C: 5.5 % (ref 4.0–5.6)

## 2023-05-01 NOTE — Patient Instructions (Signed)
[facility]  [address], [place], [place] [address] Open ? Closes 6?PM Phone: [phone]  Gwenlyn Found - Physical Therapist
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
## 2023-05-04 LAB — GENECONNECT MOLECULAR SCREEN: Genetic Analysis Overall Interpretation: NEGATIVE

## 2023-05-08 ENCOUNTER — Encounter: Payer: Self-pay | Admitting: Internal Medicine

## 2023-05-08 DIAGNOSIS — F902 Attention-deficit hyperactivity disorder, combined type: Secondary | ICD-10-CM

## 2023-05-08 DIAGNOSIS — F99 Mental disorder, not otherwise specified: Secondary | ICD-10-CM

## 2023-05-08 MED ORDER — AMPHETAMINE-DEXTROAMPHET ER 20 MG PO CP24: 20.0000 mg | ORAL_CAPSULE | Freq: Every day | ORAL | 0 refills | Status: DC

## 2023-05-08 MED ORDER — ZOLPIDEM TARTRATE 10 MG PO TABS: 10.0000 mg | ORAL_TABLET | Freq: Every evening | ORAL | 0 refills | Status: DC | PRN

## 2023-06-04 ENCOUNTER — Encounter: Payer: Self-pay | Admitting: Internal Medicine

## 2023-06-14 ENCOUNTER — Other Ambulatory Visit: Payer: Self-pay | Admitting: Internal Medicine

## 2023-06-14 DIAGNOSIS — F902 Attention-deficit hyperactivity disorder, combined type: Secondary | ICD-10-CM

## 2023-06-14 NOTE — Telephone Encounter (Signed)
 Last Ov 05/01/23 Filled 05/08/23

## 2023-07-03 ENCOUNTER — Other Ambulatory Visit: Payer: Self-pay | Admitting: Internal Medicine

## 2023-07-03 NOTE — Telephone Encounter (Signed)
 Last Ov 05/01/23 Filled 06/30/22

## 2023-07-05 ENCOUNTER — Encounter: Payer: Self-pay | Admitting: Internal Medicine

## 2023-07-05 ENCOUNTER — Ambulatory Visit: Admitting: Internal Medicine

## 2023-07-05 VITALS — BP 120/80 | HR 85 | Temp 97.8°F | Ht 65.0 in | Wt 151.6 lb

## 2023-07-05 DIAGNOSIS — F32A Depression, unspecified: Secondary | ICD-10-CM

## 2023-07-05 DIAGNOSIS — F419 Anxiety disorder, unspecified: Secondary | ICD-10-CM

## 2023-07-05 MED ORDER — CITALOPRAM HYDROBROMIDE 20 MG PO TABS: 20.0000 mg | ORAL_TABLET | Freq: Every day | ORAL | 0 refills | Status: DC

## 2023-07-05 NOTE — Progress Notes (Unsigned)
 [facility]ARE [facility] [address] [place] [address] Dept: [phone] Dept Fax: [phone]  Acute Care Office Visit  Subjective:   Desiree Wallace 07-03-1985 07/05/2023  Chief Complaint  Patient presents with   Insomnia   Medication Management    Referral for therapy     HPI: Discussed the use of AI scribe software for clinical note transcription with the patient, who gave verbal consent to proceed.  History of Present Illness   Abryanna Musolino is a 38 year old female with anxiety and insomnia who presents with worsening stress and sleep disturbances.  She experiences constant stress attributed to her job and personal responsibilities. She took on a new role at work last year that has been increasingly demanding, leading to feelings of being overwhelmed and unable to meet deadlines. She has considered quitting her job due to the stress and feels she is not managing life well.  She describes significant sleep disturbances, often staying awake until 3:30 AM. She uses Ambien  to aid sleep but is hesitant to take it when her son is with her. She also uses Adderall to manage tasks at work, which she finds does help her to stay on task.   She experiences severe anxiety attacks, characterized by sweating and a sensation of having a heart attack. She has a history of using Xanax for panic attacks.   She has tried various medications in the past, including Prozac, Lexapro, Zoloft, Effexor, Seroquel, Trazodone, Hydroxyzine, Buspar, and Wellbutrin, with varying degrees of effectiveness and side effects, such as weight gain or sedating. She is frustrated with the lack of relief from these medications.  She has a history of counseling, which she found beneficial, but has not been able to continue due to scheduling conflicts. She wants to return to counseling to help manage her stress and anxiety.        06/30/2022    2:23 PM   Depression screen PHQ 2/9  Decreased Interest 1  Down, Depressed, Hopeless 1  PHQ - 2 Score 2  Altered sleeping 3  Tired, decreased energy 3  Change in appetite 1  Feeling bad or failure about yourself  1  Trouble concentrating 1  Moving slowly or fidgety/restless 0  Suicidal thoughts 0  PHQ-9 Score 11  Difficult doing work/chores Somewhat difficult      06/30/2022    2:24 PM  GAD 7 : Generalized Anxiety Score  Nervous, Anxious, on Edge 2  Control/stop worrying 2  Worry too much - different things 1  Trouble relaxing 2  Restless 1  Easily annoyed or irritable 1  Afraid - awful might happen 3  Total GAD 7 Score 12  Anxiety Difficulty Very difficult        The following portions of the patient's history were reviewed and updated as appropriate: past medical history, past surgical history, family history, social history, allergies, medications, and problem list.   Patient Active Problem List   Diagnosis Date Noted   Acute on chronice bilateral low back pain without sciatica 03/26/2023   Anxiety and depression 06/30/2022   Attention deficit hyperactivity disorder (ADHD) 06/30/2022   Neuropathy 06/30/2022   Insomnia 06/30/2022   Mild intermittent asthma without complication 06/30/2022   Rosacea 06/30/2022   Controlled substance agreement signed 06/30/2022   Past Medical History:  Diagnosis Date   ADHD    Allergy    Anxiety    Asthma    Insomnia    Neuropathy    Rosacea  Past Surgical History:  Procedure Laterality Date   WISDOM TOOTH EXTRACTION     Family History  Problem Relation Age of Onset   Diabetes Mother    Hypertension Mother    Heart failure Mother    Asthma Brother    Cancer Paternal Grandmother        breast   Atrial fibrillation Paternal Grandmother    Thyroid disease Paternal Grandmother    Cancer Paternal Grandfather        colon    Current Outpatient Medications:    albuterol (VENTOLIN HFA) 108 (90 Base) MCG/ACT inhaler, Inhale  1-2 puffs into the lungs every 6 (six) hours as needed for wheezing or shortness of breath., Disp: , Rfl:    ALPRAZolam (XANAX) 0.5 MG tablet, Take 0.5 mg by mouth at bedtime as needed., Disp: , Rfl:    amphetamine -dextroamphetamine (ADDERALL XR) 20 MG 24 hr capsule, TAKE 1 CAPSULE BY MOUTH EVERY DAY, Disp: 30 capsule, Rfl: 0   citalopram  (CELEXA ) 20 MG tablet, Take 1 tablet (20 mg total) by mouth daily., Disp: 90 tablet, Rfl: 0   cyclobenzaprine (FLEXERIL) 5 MG tablet, Take 5 mg by mouth 3 (three) times daily., Disp: , Rfl:    doxycycline (VIBRAMYCIN) 50 MG capsule, Take 50 mg by mouth 2 (two) times daily., Disp: , Rfl:    levonorgestrel (MIRENA) 20 MCG/DAY IUD, by Intrauterine route., Disp: , Rfl:    zolpidem  (AMBIEN ) 10 MG tablet, Take 1 tablet (10 mg total) by mouth at bedtime as needed for sleep., Disp: 30 tablet, Rfl: 0 Allergies  Allergen Reactions   Minocycline Itching     ROS: A complete ROS was performed with pertinent positives/negatives noted in the HPI. The remainder of the ROS are negative.    Objective:   Today's Vitals   07/05/23 1548  BP: 120/80  Pulse: 85  Temp: 97.8 F (36.6 C)  TempSrc: Temporal  SpO2: 99%  Weight: 151 lb 9.6 oz (68.8 kg)  Height: 5\' 5"  (1.651 m)    GENERAL: Well-appearing, in NAD. Well nourished.  SKIN: Pink, warm and dry.  RESPIRATORY: Chest wall symmetrical. Respirations even and non-labored. PSYCH/MENTAL STATUS: Alert, oriented x 3. Cooperative, anxious affect.    No results found for any visits on 07/05/23.    Assessment & Plan:  Assessment and Plan    Generalized anxiety disorder and Depression Chronic anxiety worsened by stress and sleep deprivation. Increased anxiety attacks with diaphoresis and chest pain. Limited success with previous medications. Seroquel considered for mood stabilization and sleep, but sedation is a concern. Possible psychiatry referral if current management fails. - Restart counseling with flexible hours or  virtual options. - Continue alprazolam as needed for severe anxiety attacks. - Initiate citalopram  once daily. - Consider psychiatry referral for medication management if symptoms persist.      Meds ordered this encounter  Medications   citalopram  (CELEXA ) 20 MG tablet    Sig: Take 1 tablet (20 mg total) by mouth daily.    Dispense:  90 tablet    Refill:  0    Supervising Provider:   THOMPSON, AARON B [[id number]]   No orders of the defined types were placed in this encounter.  Lab Orders  No laboratory test(s) ordered today   No images are attached to the encounter or orders placed in the encounter.  Return for Scheduled Routine Office Visits and as needed.   Gavin Kast, FNP

## 2023-07-12 ENCOUNTER — Ambulatory Visit: Admitting: Psychology

## 2023-07-12 DIAGNOSIS — F32A Depression, unspecified: Secondary | ICD-10-CM | POA: Diagnosis not present

## 2023-07-12 DIAGNOSIS — F419 Anxiety disorder, unspecified: Secondary | ICD-10-CM
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
## 2023-07-17 ENCOUNTER — Ambulatory Visit (INDEPENDENT_AMBULATORY_CARE_PROVIDER_SITE_OTHER): Admitting: Psychology

## 2023-07-17 DIAGNOSIS — F32A Depression, unspecified: Secondary | ICD-10-CM

## 2023-07-17 DIAGNOSIS — F419 Anxiety disorder, unspecified: Secondary | ICD-10-CM

## 2023-07-17 NOTE — Progress Notes (Signed)
 [facility] Counselor/Therapist Progress Note  Patient ID: Dora Hatheway, MRN: [id number]   Date: 07/17/23  Time Spent: 5:10 pm - 5:54  pm : 44  minutes  (we started late because patient was scheduled for a Caregility appt, but was listed in Epic as in person.)  Treatment Type: Individual Therapy.  Reported Symptoms: Anxiety: Feeling nervous, anxious or on edge, not being able to stop or control worrying, worrying too much about different things, trouble relaxing, being so restless that it is hard to sit still, becoming easily annoyed or irritable, feeling afraid as if something awful might happen. Depression: little interest or pleasure in doing things, feeling down, depressed, or hopeless, trouble falling or staying asleep, or sleeping too much, feeling tired or having little energy, poor appetite or overeating, Feeling bad about yourself--or that you are a failure or have let yourself or your family down, trouble concentrating on things, such as reading the newspaper or watching television.   Mental Status Exam: Appearance:  Well Groomed     Behavior: Appropriate  Motor: Normal  Speech/Language:  Normal Rate  Affect: Appropriate  Mood: anxious  Thought process: normal  Thought content:   WNL  Sensory/Perceptual disturbances:   WNL  Orientation: oriented to person, place, and time/date  Attention: Good  Concentration: Good  Memory: WNL  Fund of knowledge:  Good  Insight:   Good  Judgment:  Good  Impulse Control: Good   Risk Assessment: Danger to Self:  No Self-injurious Behavior: No Danger to Others: No Duty to Warn:no Physical Aggression / Violence:No  Access to Firearms a concern: No  Gang Involvement:No   Subjective:   Mirari Ficklin participated from car, via video and consented to treatment. Therapist participated from office. I discussed the limitations of evaluation and management by telemedicine and the availability of in person appointments.  The patient expressed understanding and agreed to proceed. Tram reviewed the events of the past week.   Patient is concerned because she continues to have too much to do at work. 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 By June 18, many people will be gone for the summer. That gives patient 5 weeks that she needs to "get through" until the end of the year. 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   We reviewed numerous treatment approaches including CBT, BA, Problem Solving, and Solution focused therapy. Psych-education regarding the Anndrea's diagnosis of Anxiety and depression [F41.9, F32.A ) was provided during the session. We discussed Jaunita Messier treatment goals which include: Patient will continue to remind her boss that he needs to delegate the tasks that she discussed with him earlier this year.Patient will exercise more, particularly on the days that she doesn't have her son. Patient planned to talk with her PCP regarding her Xanax, and to follow up with her PCP regarding their discussion about patient seeing a psychiatrist.  Girlee Minette provided verbal approval of the treatment plan.   Interventions: Psycho-education & Goal Setting.   Diagnosis:  Anxiety and depression [F41.9, F32.A )   Psychiatric Treatment: Yes , PCP may be referring patient to a psychiatrist  Treatment Plan:  Client Abilities/Strengths Melynn is verbal, engaging, and motivated to do things differently.   Support System: parents  Client Treatment Preferences OPT  Client  Statement of Needs Taitlyn would like to delegate several of her tasks that she has already discussed with her boss so she will feel less anxious about those tasks. Patient would like to increase her exercise regimen, because in the  past exercising has decreased her feelings of depression. Patient will follow up with her PCP regarding being referred to a psychiatrist.   Treatment Level Weekly/Biweekly  Symptoms  Anxiety: Feeling nervous, anxious or on edge, not being able to stop or control worrying, worrying too much about different things, trouble relaxing, being so restless that it is hard to sit still, becoming easily annoyed or irritable, feeling afraid as if something awful might happen.  (Status : maintained)  Depression: little interest or pleasure in doing things, feeling down, depressed, or hopeless, trouble falling or staying asleep, or sleeping too much, feeling tired or having little energy, poor appetite or overeating, Feeling bad about yourself--or that you are a failure or have let yourself or your family down, trouble concentrating on things, such as reading the newspaper or watching television. (Status : maintained)   Goals:   Tarea experiences symptoms of anxiety and depression.  Treatment plan signed and available on s-drive:  No    Target Date: 07/11/2024 Frequency: Biweekly  Progress: 0 Modality: individual    Therapist will provide referrals for additional resources as appropriate.  Therapist will provide psycho-education regarding Lillien's diagnosis and corresponding treatment approaches and interventions. Fran Imus will support the patient's ability to achieve the goals identified. will employ CBT, BA, Problem-solving, Solution Focused, Mindfulness,  coping skills, & other evidenced-based practices will be used to promote progress towards healthy functioning to help manage decrease symptoms associated with their diagnosis.   Reduce overall level, frequency, and intensity of the feelings of depression, anxiety and panic evidenced by decreased overall symptoms from 6 to 7 days/week to 0 to 1 days/week per client report for at least 3 consecutive months. Verbally express understanding of the  relationship between feelings of depression and anxiety and their impact on thinking patterns and behaviors. Verbalize an understanding of the role that distorted thinking plays in creating fears, excessive worry, and ruminations.  Ray Caffey participated in the creation of the treatment plan)        Fran Imus

## 2023-07-24 ENCOUNTER — Other Ambulatory Visit: Payer: Self-pay | Admitting: Internal Medicine

## 2023-07-24 DIAGNOSIS — F902 Attention-deficit hyperactivity disorder, combined type: Secondary | ICD-10-CM

## 2023-07-24 NOTE — Telephone Encounter (Signed)
 Last Ov 07/05/23 Filled 06/15/23

## 2023-08-01 ENCOUNTER — Ambulatory Visit (INDEPENDENT_AMBULATORY_CARE_PROVIDER_SITE_OTHER): Payer: 59 | Admitting: Internal Medicine

## 2023-08-01 ENCOUNTER — Encounter: Payer: Self-pay | Admitting: Internal Medicine

## 2023-08-01 VITALS — BP 120/76 | HR 87 | Temp 97.6°F | Ht 65.5 in | Wt 149.0 lb

## 2023-08-01 DIAGNOSIS — Z23 Encounter for immunization: Secondary | ICD-10-CM | POA: Diagnosis not present

## 2023-08-01 DIAGNOSIS — Z0000 Encounter for general adult medical examination without abnormal findings: Secondary | ICD-10-CM

## 2023-08-01 DIAGNOSIS — Z1231 Encounter for screening mammogram for malignant neoplasm of breast: Secondary | ICD-10-CM

## 2023-08-01 DIAGNOSIS — Z13220 Encounter for screening for lipoid disorders: Secondary | ICD-10-CM

## 2023-08-01 LAB — COMPREHENSIVE METABOLIC PANEL WITH GFR
ALT: 10 U/L (ref 0–35)
AST: 13 U/L (ref 0–37)
Albumin: 4.4 g/dL (ref 3.5–5.2)
Alkaline Phosphatase: 76 U/L (ref 39–117)
BUN: 9 mg/dL (ref 6–23)
CO2: 26 meq/L (ref 19–32)
Calcium: 9.2 mg/dL (ref 8.4–10.5)
Chloride: 103 meq/L (ref 96–112)
Creatinine, Ser: 0.74 mg/dL (ref 0.40–1.20)
GFR: 101.65 mL/min (ref >60.00)
Glucose, Bld: 96 mg/dL (ref 70–99)
Potassium: 4.2 meq/L (ref 3.5–5.1)
Sodium: 135 meq/L (ref 135–145)
Total Bilirubin: 0.7 mg/dL (ref 0.2–1.2)
Total Protein: 6.7 g/dL (ref 6.0–8.3)

## 2023-08-01 LAB — CBC WITH DIFFERENTIAL/PLATELET
Basophils Absolute: 0 10*3/uL (ref 0.0–0.1)
Basophils Relative: 0.8 % (ref 0.0–3.0)
Eosinophils Absolute: 0 10*3/uL (ref 0.0–0.7)
Eosinophils Relative: 0.6 % (ref 0.0–5.0)
HCT: 40.9 % (ref 36.0–46.0)
Hemoglobin: 13.5 g/dL (ref 12.0–15.0)
Lymphocytes Relative: 36.9 % (ref 12.0–46.0)
Lymphs Abs: 1.7 10*3/uL (ref 0.7–4.0)
MCHC: 33.1 g/dL (ref 30.0–36.0)
MCV: 88.9 fl (ref 78.0–100.0)
Monocytes Absolute: 0.4 10*3/uL (ref 0.1–1.0)
Monocytes Relative: 7.9 % (ref 3.0–12.0)
Neutro Abs: 2.5 10*3/uL (ref 1.4–7.7)
Neutrophils Relative %: 53.8 % (ref 43.0–77.0)
Platelets: 282 10*3/uL (ref 150.0–400.0)
RBC: 4.6 Mil/uL (ref 3.87–5.11)
RDW: 13.7 % (ref 11.5–15.5)
WBC: 4.7 10*3/uL (ref 4.0–10.5)

## 2023-08-01 LAB — LIPID PANEL
Cholesterol: 130 mg/dL (ref 0–200)
HDL: 53.7 mg/dL (ref >39.00)
LDL Cholesterol: 70 mg/dL (ref 0–99)
NonHDL: 76.68
Total CHOL/HDL Ratio: 2
Triglycerides: 35 mg/dL (ref 0.0–149.0)
VLDL: 7 mg/dL (ref 0.0–40.0)

## 2023-08-01 LAB — TSH: TSH: 1.54 u[IU]/mL (ref 0.35–5.50)

## 2023-08-01 NOTE — Progress Notes (Signed)
 Subjective:   Desiree Wallace Aug 21, 1985  08/01/2023   CC: Chief Complaint  Patient presents with   Annual Exam    Fasting     HPI: Desiree Wallace is a 38 y.o. female who presents for a routine health maintenance exam.  Labs collected at time of visit.    HEALTH SCREENINGS: - Pap smear: up to date - Mammogram (40+): due in August when she turns 38  - Colonoscopy (45+): Not applicable  - Bone Density (65+): Not applicable  - Lung CA screening with low-dose CT:  Not applicable Adults age 48-80 who are current cigarette smokers or quit within the last 15 years. Must have 20 pack year history.   Depression and Anxiety Screen done today and results listed below:     08/01/2023    8:33 AM 06/30/2022    2:23 PM  Depression screen PHQ 2/9  Decreased Interest 2 1  Down, Depressed, Hopeless 2 1  PHQ - 2 Score 4 2  Altered sleeping 3 3  Tired, decreased energy 3 3  Change in appetite 2 1  Feeling bad or failure about yourself  2 1  Trouble concentrating 1 1  Moving slowly or fidgety/restless 0 0  Suicidal thoughts 0 0  PHQ-9 Score 15 11  Difficult doing work/chores Very difficult Somewhat difficult      08/01/2023    8:33 AM 06/30/2022    2:24 PM  GAD 7 : Generalized Anxiety Score  Nervous, Anxious, on Edge 3 2  Control/stop worrying 3 2  Worry too much - different things 2 1  Trouble relaxing 1 2  Restless 0 1  Easily annoyed or irritable 3 1  Afraid - awful might happen 3 3  Total GAD 7 Score 15 12  Anxiety Difficulty Very difficult Very difficult    IMMUNIZATIONS: - Tdap: Tetanus vaccination status reviewed: Td vaccination indicated and given today.    Past medical history, surgical history, medications, allergies, family history and social history reviewed with patient today and changes made to appropriate areas of the chart.   Past Medical History:  Diagnosis Date   ADHD    Allergy    Anxiety    Asthma    Insomnia    Neuropathy    Rosacea      Past Surgical History:  Procedure Laterality Date   WISDOM TOOTH EXTRACTION      Current Outpatient Medications on File Prior to Visit  Medication Sig   albuterol (VENTOLIN HFA) 108 (90 Base) MCG/ACT inhaler Inhale 1-2 puffs into the lungs every 6 (six) hours as needed for wheezing or shortness of breath.   ALPRAZolam (XANAX) 0.5 MG tablet Take 0.5 mg by mouth at bedtime as needed.   amphetamine -dextroamphetamine (ADDERALL XR) 20 MG 24 hr capsule TAKE 1 CAPSULE BY MOUTH EVERY DAY   citalopram  (CELEXA ) 20 MG tablet Take 1 tablet (20 mg total) by mouth daily.   cyclobenzaprine (FLEXERIL) 5 MG tablet Take 5 mg by mouth as needed.   doxycycline (VIBRAMYCIN) 50 MG capsule Take 50 mg by mouth 2 (two) times daily.   levonorgestrel (MIRENA) 20 MCG/DAY IUD by Intrauterine route.   zolpidem  (AMBIEN ) 10 MG tablet Take 1 tablet (10 mg total) by mouth at bedtime as needed for sleep.   No current facility-administered medications on file prior to visit.    Allergies  Allergen Reactions   Minocycline Itching     Social History   Socioeconomic History   Marital status: Single  Spouse name: Not on file   Number of children: 1   Years of education: Not on file   Highest education level: Associate degree: occupational, Scientist, product/process development, or vocational program  Occupational History   Not on file  Tobacco Use   Smoking status: Never   Smokeless tobacco: Never  Vaping Use   Vaping status: Never Used  Substance and Sexual Activity   Alcohol use: Not Currently   Drug use: Never   Sexual activity: Not Currently    Birth control/protection: I.U.D.    Comment: mirena placed 03/2022  Other Topics Concern   Not on file  Social History Narrative   Not on file   Social Drivers of Health   Financial Resource Strain: Not on file  Food Insecurity: Not on file  Transportation Needs: No Transportation Needs (07/22/2019)   PRAPARE - Administrator, Civil Service (Medical): No    Lack of  Transportation (Non-Medical): No  Physical Activity: Not on file  Stress: Not on file  Social Connections: Not on file  Intimate Partner Violence: Not on file   Social History   Tobacco Use  Smoking Status Never  Smokeless Tobacco Never   Social History   Substance and Sexual Activity  Alcohol Use Not Currently    Family History  Problem Relation Age of Onset   Diabetes Mother    Hypertension Mother    Heart failure Mother    Asthma Brother    Cancer Paternal Grandmother        breast   Atrial fibrillation Paternal Grandmother    Thyroid disease Paternal Grandmother    Cancer Paternal Grandfather        colon     ROS: Denies fever, fatigue, unexplained weight loss/gain, hearing or vision changes, cardiac or respiratory complaints. Denies neurological deficits, musculoskeletal complaints, gastrointestinal or genitourinary complaints, mental health complaints, and skin changes.   Objective:   Today's Vitals   08/01/23 0829  BP: 120/76  Pulse: 87  Temp: 97.6 F (36.4 C)  TempSrc: Temporal  SpO2: 98%  Weight: 149 lb (67.6 kg)  Height: 5' 5.5" (1.664 m)    GENERAL APPEARANCE: Well-appearing, in NAD. Well nourished.  SKIN: Pink, warm and dry. Turgor normal. No rash, lesion, ulceration, or ecchymoses. Hair evenly distributed.  HEENT: HEAD: Normocephalic.  EYES: PERRLA. EOMI. Lids intact w/o defect. Sclera white, Conjunctiva pink w/o exudate.  EARS: External ear w/o redness, swelling, masses or lesions. EAC clear. TM's intact, translucent w/o bulging, appropriate landmarks visualized. Appropriate acuity to conversational tones.  NOSE: Septum midline w/o deformity. Nares patent, mucosa pink and non-inflamed w/o drainage.  THROAT: Uvula midline. Oropharynx clear. Tonsils non-inflamed w/o exudate . Oral mucosa pink and moist.  NECK: Supple, Trachea midline. Full ROM w/o pain or tenderness. No lymphadenopathy. Thyroid non-tender w/o enlargement or palpable masses.   BREASTS: Breasts pendulous, symmetrical, and w/o palpable masses. Nipples everted and w/o discharge. No rash or skin retraction. No axillary or supraclavicular lymphadenopathy.  RESPIRATORY: Chest wall symmetrical w/o masses. Respirations even and non-labored. Breath sounds clear to auscultation bilaterally. No wheezes, rales, rhonchi, or crackles. CARDIAC: S1, S2 present, regular rate and rhythm. No gallops, murmurs, rubs, or clicks. Capillary refill <2 seconds. Peripheral pulses 2+ bilaterally. GI: Abdomen soft w/o distention. Normoactive bowel sounds. No palpable masses or tenderness. No guarding or rebound tenderness. Liver and spleen w/o tenderness or enlargement. No CVA tenderness.  MSK: Muscle tone and strength appropriate for age, w/o atrophy or abnormal movement.  EXTREMITIES: Active  ROM intact, w/o tenderness, crepitus, or contracture. No obvious joint deformities or effusions. No clubbing, edema, or cyanosis.  NEUROLOGIC: CN's II-XII intact. Motor strength symmetrical with no obvious weakness. No sensory deficits. Steady, even gait.  PSYCH/MENTAL STATUS: Alert, oriented x 3. Cooperative, appropriate mood and affect.    Assessment & Plan:  Encounter for general adult medical examination without abnormal findings -     CBC with Differential/Platelet -     Comprehensive metabolic panel with GFR -     TSH -     Lipid panel  Immunization due -     Tdap vaccine greater than or equal to 7yo IM  Breast cancer screening by mammogram -     3D Screening Mammogram, Left and Right; Future    Orders Placed This Encounter  Procedures   MM 3D SCREENING MAMMOGRAM BILATERAL BREAST    Standing Status:   Future    Expected Date:   11/01/2023    Expiration Date:   07/31/2024    Scheduling Instructions:     [facility]      [address], [place], [place] [address]    Reason for Exam (SYMPTOM  OR DIAGNOSIS REQUIRED):   screening for breast cancer    Preferred  imaging location?:   External             [facility] in [place]    Is the patient pregnant?:   No   Tdap vaccine greater than or equal to 7yo IM   CBC with Differential/Platelet   Comprehensive metabolic panel with GFR   TSH   Lipid panel    PATIENT COUNSELING:  - Encouraged a healthy well-balanced diet. Patient may adjust caloric intake to maintain or achieve ideal body weight. May reduce intake of dietary saturated fat and total fat and have adequate dietary potassium and calcium preferably from fresh fruits, vegetables, and low-fat dairy products.   - Stressed the importance of regular exercise  NEXT PREVENTATIVE PHYSICAL DUE IN 1 YEAR.  Return in about 6 months (around 02/01/2024) for Anxiety/Depression, asthma, ADHD.  Gavin Kast, FNP

## 2023-08-02 ENCOUNTER — Ambulatory Visit: Payer: Self-pay | Admitting: Internal Medicine

## 2023-08-15 ENCOUNTER — Ambulatory Visit: Payer: Self-pay | Admitting: *Deleted

## 2023-08-15 ENCOUNTER — Other Ambulatory Visit: Payer: Self-pay

## 2023-08-15 ENCOUNTER — Ambulatory Visit
Admission: RE | Admit: 2023-08-15 | Discharge: 2023-08-15 | Disposition: A | Discharge status: Home / Self Care | Source: Ambulatory Visit

## 2023-08-15 ENCOUNTER — Ambulatory Visit (INDEPENDENT_AMBULATORY_CARE_PROVIDER_SITE_OTHER): Admitting: Radiology

## 2023-08-15 VITALS — BP 122/80 | HR 89 | Temp 98.5°F | Resp 20 | Ht 65.0 in | Wt 150.0 lb

## 2023-08-15 DIAGNOSIS — S67197A Crushing injury of left little finger, initial encounter: Secondary | ICD-10-CM

## 2023-08-15 NOTE — Telephone Encounter (Signed)
 Copied from CRM [id number]. Topic: Clinical - Red Word Triage >> Aug 15, 2023  2:44 PM Juleen Oakland F wrote: Red Word that prompted transfer to Nurse Triage: Patient shut left pinky finger in care door and it's very painful, request appt for today Reason for Disposition  Sounds like a serious injury to the triager    Numb on the fingertip of left pinky finger after closing the door on it.  Answer Assessment - Initial Assessment Questions 1. MECHANISM: "How did the injury happen?"      I closed my car door on my lft pinky finger.   I opened the door and got it out because it was stuck in there.   2. ONSET: "When did the injury happen?" (Minutes or hours ago)      Just happened.   Nothing is cut or severed.   It's painful.   It's numb on the end.  I can bend it at both joints.   It's bruised. 3. LOCATION: "What part of the finger is injured?" "Is the nail damaged?"      Tip of left pinky finger. 4. APPEARANCE of the INJURY: "What does the injury look like?"      Bruised. 5. SEVERITY: "Can you use the hand normally?"  "Can you bend your fingers into a ball and then fully open them?"     I can bend it but it's numb feeling at the end of my finger.   6. SIZE: For cuts, bruises, or swelling, ask: "How large is it?" (e.g., inches or centimeters;  entire finger)      Bruised.   7. PAIN: "Is there pain?" If Yes, ask: "How bad is the pain?"    (e.g., Scale 1-10; or mild, moderate, severe)  - NONE (0): no pain.  - MILD (1-3): doesn't interfere with normal activities.   - MODERATE (4-7): interferes with normal activities or awakens from sleep.  - SEVERE (8-10): excruciating pain, unable to hold a glass of water or bend finger even a little.     Moderate 8. TETANUS: For any breaks in the skin, ask: "When was the last tetanus booster?"     No breaks.    Just had tetanus shot 9. OTHER SYMPTOMS: "Do you have any other symptoms?"     No 10. PREGNANCY: "Is there any chance you are pregnant?" "When was your last  menstrual period?"       Not asked  Protocols used: Finger Injury-A-AH FYI Only or Action Required?: FYI only for provider Pt closed her left pinky finger in the car door.  Tip is numb.  Not deformed, can bend it.  Bruised and painful.   Referred to urgent care for x ray and evaluation.      Patient was last seen in primary care on 08/01/2023 by Gavin Kast, FNP. Called Nurse Triage reporting Hand Injury. Symptoms began today. Interventions attempted: Nothing. Symptoms are: gradually worsening.  Triage Disposition: Go to ED Now (Notify PCP) Referred to the urgent care.    Patient/caregiver understands and will follow disposition?:   Yes.

## 2023-08-15 NOTE — ED Triage Notes (Addendum)
 Pt presents with complaints of left pinky finger injury today at approximately 2:30 PM after shutting in car door on accident. Pt currently rates her overall pain a 4/10, describes as burning and numbness. OTC Ibuprofen  taken with no noticeable relief. Ice pack also applied. Pt states she can bend the finger.   Ice provided by this RN for pain management.

## 2023-08-15 NOTE — Telephone Encounter (Signed)
 Needs to go to urgent care for xray. If patient declines, she can be put on my schedule for tomorrow in same day appt slot

## 2023-08-15 NOTE — Telephone Encounter (Signed)
Forwarding message

## 2023-08-15 NOTE — ED Provider Notes (Signed)
 [facility]    CSN: [id number] Arrival date & time: 08/15/23  1545      History   Chief Complaint Chief Complaint  Patient presents with   Finger Injury    Shut finger in car door. Unable to get appointment with my PCP. Nurse on call at my doctors office told me to get an X-ray now to rule out break or fracture so the finger could be splinted if needed - Entered by patient    HPI Desiree Wallace is a 38 y.o. female.   HPI Pt reports she accidentally shut her left little finger in her car door earlier today She reports that parts of the distal aspect of her little finger feel numb but other parts are burning  She reports she is able to bend the finger without much issue Interventions: Ibuprofen  and she is icing the area with cool compress  She is right-hand dominant  Past Medical History:  Diagnosis Date   ADHD    Allergy    Anxiety    Asthma    Insomnia    Neuropathy    Rosacea     Patient Active Problem List   Diagnosis Date Noted   Acute on chronice bilateral low back pain without sciatica 03/26/2023   Anxiety and depression 06/30/2022   Attention deficit hyperactivity disorder (ADHD) 06/30/2022   Neuropathy 06/30/2022   Insomnia 06/30/2022   Mild intermittent asthma without complication 06/30/2022   Rosacea 06/30/2022   Controlled substance agreement signed 06/30/2022    Past Surgical History:  Procedure Laterality Date   WISDOM TOOTH EXTRACTION      OB History   No obstetric history on file.      Home Medications    Prior to Admission medications   Medication Sig Start Date End Date Taking? Authorizing Provider  estradiol (ESTRACE) 0.1 MG/GM vaginal cream SMARTSIG:Gram(s) Vaginal Twice Daily 07/04/23  Yes [provider]  albuterol (VENTOLIN HFA) 108 (90 Base) MCG/ACT inhaler Inhale 1-2 puffs into the lungs every 6 (six) hours as needed for wheezing or shortness of breath.    [provider]  ALPRAZolam (XANAX) 0.5  MG tablet Take 0.5 mg by mouth at bedtime as needed. 12/31/20   [provider]  amphetamine -dextroamphetamine (ADDERALL XR) 20 MG 24 hr capsule TAKE 1 CAPSULE BY MOUTH EVERY DAY 07/24/23   Gavin Kast, FNP  citalopram  (CELEXA ) 20 MG tablet Take 1 tablet (20 mg total) by mouth daily. 07/05/23   Gavin Kast, FNP  cyclobenzaprine (FLEXERIL) 5 MG tablet Take 5 mg by mouth as needed. 03/01/23   [provider]  doxycycline (VIBRAMYCIN) 50 MG capsule Take 50 mg by mouth 2 (two) times daily. 04/02/23   [provider]  levonorgestrel (MIRENA) 20 MCG/DAY IUD by Intrauterine route. 08/13/18   [provider]  zolpidem  (AMBIEN ) 10 MG tablet Take 1 tablet (10 mg total) by mouth at bedtime as needed for sleep. 05/08/23   Gavin Kast, FNP    Family History Family History  Problem Relation Age of Onset   Diabetes Mother    Hypertension Mother    Heart failure Mother    Asthma Brother    Cancer Paternal Grandmother        breast   Atrial fibrillation Paternal Grandmother    Thyroid disease Paternal Grandmother    Cancer Paternal Grandfather        colon    Social History Social History   Tobacco Use   Smoking status: Never  Smokeless tobacco: Never  Vaping Use   Vaping status: Never Used  Substance Use Topics   Alcohol use: Not Currently   Drug use: Never     Allergies   Minocycline   Review of Systems Review of Systems  Musculoskeletal:        Finger injury       Physical Exam Triage Vital Signs ED Triage Vitals  Encounter Vitals Group     BP 08/15/23 1655 122/80     Systolic BP Percentile --      Diastolic BP Percentile --      Pulse Rate 08/15/23 1655 89     Resp 08/15/23 1655 20     Temp 08/15/23 1655 98.5 F (36.9 C)     Temp Source 08/15/23 1655 Oral     SpO2 08/15/23 1655 96 %     Weight 08/15/23 1657 150 lb (68 kg)     Height 08/15/23 1657 5\' 5"  (1.651 m)     Head Circumference --      Peak Flow --      Pain Score  08/15/23 1656 4     Pain Loc --      Pain Education --      Exclude from Growth Chart --    No data found.  Updated Vital Signs BP 122/80 (BP Location: Right Arm)   Pulse 89   Temp 98.5 F (36.9 C) (Oral)   Resp 20   Ht 5\' 5"  (1.651 m)   Wt 150 lb (68 kg)   SpO2 96%   BMI 24.96 kg/m   Visual Acuity Right Eye Distance:   Left Eye Distance:   Bilateral Distance:    Right Eye Near:   Left Eye Near:    Bilateral Near:     Physical Exam Vitals reviewed.  Constitutional:      General: She is awake.     Appearance: Normal appearance. She is well-developed and well-groomed.  HENT:     Head: Normocephalic and atraumatic.  Eyes:     General: Lids are normal. Gaze aligned appropriately.     Extraocular Movements: Extraocular movements intact.     Conjunctiva/sclera: Conjunctivae normal.  Pulmonary:     Effort: Pulmonary effort is normal.  Musculoskeletal:     Left hand: Swelling and tenderness present. No deformity or lacerations. Normal range of motion. Normal strength. Normal sensation. Normal capillary refill. Normal pulse.     Comments: Pt has redness and bruising along palmar aspect of left little finger She is able to completely flex and extend the finger without pain  Cap refill is <2 sec at distal aspect of left little finger   Neurological:     General: No focal deficit present.     Mental Status: She is alert and oriented to person, place, and time.     GCS: GCS eye subscore is 4. GCS verbal subscore is 5. GCS motor subscore is 6.     Cranial Nerves: No cranial nerve deficit, dysarthria or facial asymmetry.  Psychiatric:        Attention and Perception: Attention and perception normal.        Mood and Affect: Mood and affect normal.        Speech: Speech normal.        Behavior: Behavior normal. Behavior is cooperative.      UC Treatments / Results  Labs (all labs ordered are listed, but only abnormal results are displayed) Labs Reviewed - No data to  display  EKG   Radiology DG Finger Little Left Result Date: 08/15/2023 CLINICAL DATA:  Blunt trauma with crushing injury to the fifth finger. Burning and numbness. Slammed the finger in a car door. EXAM: LEFT FINGER(S) - 2+ VIEW COMPARISON:  None Available. FINDINGS: There is no evidence of fracture or dislocation. There is no evidence of arthropathy or other focal bone abnormality. Soft tissues are unremarkable. IMPRESSION: No acute bony abnormalities. Electronically Signed   By: Boyce Byes M.D.   On: 08/15/2023 17:29    Procedures Procedures (including critical care time)  Medications Ordered in UC Medications - No data to display  Initial Impression / Assessment and Plan / UC Course  I have reviewed the triage vital signs and the nursing notes.  Pertinent labs & imaging results that were available during my care of the patient were reviewed by me and considered in my medical decision making (see chart for details).      Final Clinical Impressions(s) / UC Diagnoses   Final diagnoses:  Crushing injury of left little finger, initial encounter   Patient presents today with concerns for blunt trauma to the left little finger that occurred earlier today.  She reports that she accidentally closed her left little finger in a car door.  Palmar aspect of the left little finger is erythematous and slightly bruised along the distal phalanx.  She has intact ROM and appears to be neurovascularly intact, cap refill is less than 2 seconds.  Radiology review of left little finger imaging does not note acute fracture or dislocation.  I have reviewed imaging results with patient during appointment.  Given reassuring physical exam as well as negative x-ray I suspect likely soft tissue injury at this time.  Recommend conservative measures such as warm and cool compresses, Tylenol and ibuprofen  as needed for pain management.  Patient declines offer of a splint to assist with stability.  ED and return  precautions reviewed and provided in after visit summary.  Follow-up as needed.    Discharge Instructions      You were seen today for concerns of a left little finger injury.  At this time you have some mild swelling and bruising but your range of motion appears intact. Your x-ray was negative for signs of a fracture or dislocation at this time. I suspect you likely have sustained a soft tissue injury so you may continue to have some bruising and redness as well as tenderness to the area over the next few weeks. As needed you can use Tylenol and ibuprofen  for pain management.  I recommend cool compresses and icing for the next 48 hours and then you can move to warm compresses after that.   For compresses I recommend at the most 15 minutes on and at least 30 minutes off to help prevent tissue damage or injury. If at any point you start to develop more severe bruising, swelling, inability to bend your finger, persistent numbness or tingling I recommend following up with your primary care provider for further evaluation.    ED Prescriptions   None    PDMP not reviewed this encounter.   Jerona Mooring, PA-C 08/15/23 1744

## 2023-08-15 NOTE — Discharge Instructions (Addendum)
 You were seen today for concerns of a left little finger injury.  At this time you have some mild swelling and bruising but your range of motion appears intact. Your x-ray was negative for signs of a fracture or dislocation at this time. I suspect you likely have sustained a soft tissue injury so you may continue to have some bruising and redness as well as tenderness to the area over the next few weeks. As needed you can use Tylenol and ibuprofen  for pain management.  I recommend cool compresses and icing for the next 48 hours and then you can move to warm compresses after that.   For compresses I recommend at the most 15 minutes on and at least 30 minutes off to help prevent tissue damage or injury. If at any point you start to develop more severe bruising, swelling, inability to bend your finger, persistent numbness or tingling I recommend following up with your primary care provider for further evaluation.

## 2023-08-24 ENCOUNTER — Other Ambulatory Visit: Payer: Self-pay | Admitting: Internal Medicine

## 2023-08-24 DIAGNOSIS — F902 Attention-deficit hyperactivity disorder, combined type: Secondary | ICD-10-CM

## 2023-08-24 NOTE — Telephone Encounter (Signed)
 Last Ov 07/2123 Filled 07/24/23

## 2023-09-28 ENCOUNTER — Other Ambulatory Visit: Payer: Self-pay | Admitting: Internal Medicine

## 2023-09-28 DIAGNOSIS — F902 Attention-deficit hyperactivity disorder, combined type: Secondary | ICD-10-CM

## 2023-09-28 DIAGNOSIS — F5105 Insomnia due to other mental disorder: Secondary | ICD-10-CM

## 2023-09-28 NOTE — Telephone Encounter (Signed)
 Last Ov 08/01/23 Filled 08/25/23 05/08/23

## 2023-09-29 ENCOUNTER — Other Ambulatory Visit: Payer: Self-pay | Admitting: Internal Medicine

## 2023-09-29 DIAGNOSIS — F419 Anxiety disorder, unspecified: Secondary | ICD-10-CM

## 2023-10-01 NOTE — Telephone Encounter (Signed)
 Last Ov 08/01/23 Filled 07/05/23

## 2023-10-05 ENCOUNTER — Encounter: Payer: Self-pay | Admitting: Internal Medicine

## 2023-10-05 DIAGNOSIS — F32A Depression, unspecified: Secondary | ICD-10-CM

## 2023-10-08 MED ORDER — ALBUTEROL SULFATE HFA 108 (90 BASE) MCG/ACT IN AERS: 1.0000 | INHALATION_SPRAY | RESPIRATORY_TRACT | 3 refills | Status: AC | PRN

## 2023-10-08 MED ORDER — ALPRAZOLAM 0.5 MG PO TABS: 0.5000 mg | ORAL_TABLET | Freq: Every evening | ORAL | 1 refills | Status: DC | PRN

## 2023-10-08 MED ORDER — CITALOPRAM HYDROBROMIDE 20 MG PO TABS
20.0000 mg | ORAL_TABLET | Freq: Every day | ORAL | 3 refills | Status: AC
Start: 2023-10-08 — End: ?

## 2023-10-31 ENCOUNTER — Other Ambulatory Visit: Payer: Self-pay | Admitting: Internal Medicine

## 2023-10-31 DIAGNOSIS — F902 Attention-deficit hyperactivity disorder, combined type: Secondary | ICD-10-CM

## 2023-10-31 NOTE — Telephone Encounter (Signed)
 Last Ov 08/01/23 Filled 09/28/23

## 2023-12-12 ENCOUNTER — Other Ambulatory Visit: Payer: Self-pay | Admitting: Internal Medicine

## 2023-12-12 DIAGNOSIS — F902 Attention-deficit hyperactivity disorder, combined type: Secondary | ICD-10-CM

## 2023-12-12 NOTE — Telephone Encounter (Signed)
 Pt requesting refill for amphetamine -dextroamphetamine (ADDERALL XR) 20 MG 24 hr capsule   LOV 08/01/23 FOV 01/31/24 LRF 10/31/23

## 2023-12-18 ENCOUNTER — Encounter: Payer: Self-pay | Admitting: Internal Medicine

## 2023-12-18 DIAGNOSIS — F419 Anxiety disorder, unspecified: Secondary | ICD-10-CM

## 2023-12-19 MED ORDER — ALPRAZOLAM 0.5 MG PO TABS: 0.5000 mg | ORAL_TABLET | Freq: Every evening | ORAL | 1 refills | Status: AC | PRN

## 2024-01-28 ENCOUNTER — Other Ambulatory Visit: Payer: Self-pay | Admitting: Internal Medicine

## 2024-01-28 DIAGNOSIS — F902 Attention-deficit hyperactivity disorder, combined type: Secondary | ICD-10-CM

## 2024-01-28 NOTE — Telephone Encounter (Signed)
 Refill request for Adderall XR 20mg  FOV 01/31/2024 LOV 08/01/2023 Last refill 12/12/2023 Medication pending for approval.

## 2024-01-31 ENCOUNTER — Ambulatory Visit: Admitting: Internal Medicine

## 2024-01-31 ENCOUNTER — Encounter: Payer: Self-pay | Admitting: Internal Medicine

## 2024-01-31 VITALS — BP 122/80 | HR 95 | Temp 99.1°F | Ht 65.0 in | Wt 150.8 lb

## 2024-01-31 DIAGNOSIS — Z1231 Encounter for screening mammogram for malignant neoplasm of breast: Secondary | ICD-10-CM

## 2024-01-31 DIAGNOSIS — F411 Generalized anxiety disorder: Secondary | ICD-10-CM | POA: Insufficient documentation

## 2024-01-31 DIAGNOSIS — J4520 Mild intermittent asthma, uncomplicated: Secondary | ICD-10-CM | POA: Diagnosis not present

## 2024-01-31 DIAGNOSIS — M6289 Other specified disorders of muscle: Secondary | ICD-10-CM | POA: Insufficient documentation

## 2024-01-31 DIAGNOSIS — F902 Attention-deficit hyperactivity disorder, combined type: Secondary | ICD-10-CM

## 2024-01-31 DIAGNOSIS — F5105 Insomnia due to other mental disorder: Secondary | ICD-10-CM

## 2024-01-31 DIAGNOSIS — F99 Mental disorder, not otherwise specified: Secondary | ICD-10-CM

## 2024-01-31 NOTE — Progress Notes (Signed)
 [facility] [address] [place] [address] Dept: [phone] Dept Fax: [phone]    Subjective:   Desiree Wallace 1985-04-17 01/31/2024  Chief Complaint  Patient presents with   Follow-up    Ask about OTC medication    HPI: Desiree Wallace presents today for re-assessment and management of chronic medical conditions.  Discussed the use of AI scribe software for clinical note transcription with the patient, who gave verbal consent to proceed.  History of Present Illness   Desiree Wallace is a 38 year old female who presents for a routine follow-up visit for her chronic conditions.  She has a history of mild intermittent asthma and uses an albuterol  inhaler as needed.  She has attention deficit hyperactivity disorder (ADHD) and takes Adderall XR 20 mg once daily. The medication helps her focus at work, although she sometimes forgets if she has taken it.  She has generalized anxiety disorder and takes Celexa  20 mg once daily. She does not experience adverse effects on this medication. Additionally, she takes Ambien  10 mg as needed for insomnia, which she attributes to anxiety and ADHD. Her sleep schedule varies depending on her responsibilities, particularly with her son.  She has also been taking OTC supplement of magnesium glycinate and L-theanine.  She recently lost her grandmother which has affected her anxiety.  She experiences headaches and was prescribed sumatriptan by a neurologist but has not yet tried it. She uses over-the-counter remedies like Goody powders and soft drinks to manage her headaches.  She is undergoing pelvic floor therapy for pelvic floor dysfunction and has been advised to try diazepam suppositories, but she has not used them yet. She reports chronic constipation and has been advised to try milk of magnesia.  She expresses concern about colon problems due to a family history of colon  cancer, as her grandfather died young from it.  No immediate family history of colon cancer.  She has been trying to incorporate yoga into her routine, which she finds beneficial for her mental health and pelvic issues. She also participated in a 5K run with her son, which she enjoyed.         01/31/2024    4:48 PM 08/01/2023    8:33 AM 06/30/2022    2:23 PM  Depression screen PHQ 2/9  Decreased Interest 1 2 1   Down, Depressed, Hopeless 1 2 1   PHQ - 2 Score 2 4 2   Altered sleeping 3 3 3   Tired, decreased energy 2 3 3   Change in appetite 2 2 1   Feeling bad or failure about yourself  1 2 1   Trouble concentrating 0 1 1  Moving slowly or fidgety/restless 0 0 0  Suicidal thoughts 0 0 0  PHQ-9 Score 10 15  11    Difficult doing work/chores Very difficult Very difficult Somewhat difficult     Data saved with a previous flowsheet row definition      01/31/2024    4:48 PM 08/01/2023    8:33 AM 06/30/2022    2:24 PM  GAD 7 : Generalized Anxiety Score  Nervous, Anxious, on Edge 2 3 2   Control/stop worrying 2 3 2   Worry too much - different things 2 2 1   Trouble relaxing 2 1 2   Restless 0 0 1  Easily annoyed or irritable 1 3 1   Afraid - awful might happen 2 3 3   Total GAD 7 Score 11 15 12   Anxiety Difficulty Very difficult Very difficult Very difficult  The following portions of the patient's history were reviewed and updated as appropriate: past medical history, past surgical history, family history, social history, allergies, medications, and problem list.   Patient Active Problem List   Diagnosis Date Noted   Pelvic floor dysfunction 01/31/2024   Generalized anxiety disorder 01/31/2024   Acute on chronice bilateral low back pain without sciatica 03/26/2023   Anxiety and depression 06/30/2022   Attention deficit hyperactivity disorder (ADHD) 06/30/2022   Neuropathy 06/30/2022   Insomnia 06/30/2022   Mild intermittent asthma without complication 06/30/2022   Rosacea  06/30/2022   Controlled substance agreement signed 06/30/2022   Past Medical History:  Diagnosis Date   ADHD    Allergy    Anxiety    Asthma    Insomnia    Neuropathy    Rosacea    Past Surgical History:  Procedure Laterality Date   WISDOM TOOTH EXTRACTION     Family History  Problem Relation Age of Onset   Diabetes Mother    Hypertension Mother    Heart failure Mother    Asthma Brother    Cancer Paternal Grandmother        breast   Atrial fibrillation Paternal Grandmother    Thyroid disease Paternal Grandmother    Cancer Paternal Grandfather        colon    Current Outpatient Medications:    albuterol  (VENTOLIN  HFA) 108 (90 Base) MCG/ACT inhaler, Inhale 1-2 puffs into the lungs every 4 (four) hours as needed for wheezing or shortness of breath., Disp: 18 g, Rfl: 3   ALPRAZolam  (XANAX ) 0.5 MG tablet, Take 1 tablet (0.5 mg total) by mouth at bedtime as needed for anxiety or sleep., Disp: 30 tablet, Rfl: 1   amphetamine -dextroamphetamine (ADDERALL XR) 20 MG 24 hr capsule, TAKE 1 CAPSULE BY MOUTH EVERY DAY, Disp: 30 capsule, Rfl: 0   citalopram  (CELEXA ) 20 MG tablet, Take 1 tablet (20 mg total) by mouth daily., Disp: 90 tablet, Rfl: 3   cyclobenzaprine (FLEXERIL) 5 MG tablet, Take 5 mg by mouth as needed., Disp: , Rfl:    doxycycline (VIBRAMYCIN) 50 MG capsule, Take 50 mg by mouth 2 (two) times daily., Disp: , Rfl:    MAGNESIUM GLYCINATE PO, Take by mouth., Disp: , Rfl:    SUMAtriptan (IMITREX) 50 MG tablet, Take 50 mg by mouth., Disp: , Rfl:    THEANINE PO, Take by mouth., Disp: , Rfl:    zolpidem  (AMBIEN ) 10 MG tablet, TAKE 1 TABLET (10 MG TOTAL) BY MOUTH AT BEDTIME AS NEEDED FOR SLEEP., Disp: 30 tablet, Rfl: 1 Allergies  Allergen Reactions   Minocycline Itching     ROS: A complete ROS was performed with pertinent positives/negatives noted in the HPI. The remainder of the ROS are negative.    Objective:   Today's Vitals   01/31/24 1524  BP: 122/80  Pulse: 95   Temp: 99.1 F (37.3 C)  TempSrc: Temporal  SpO2: 99%  Weight: 150 lb 12.8 oz (68.4 kg)  Height: 5' 5 (1.651 m)    GENERAL: Well-appearing, in NAD. Well nourished.  SKIN: Pink, warm and dry. No rash, lesion, ulceration, or ecchymoses.  NECK: Trachea midline. Full ROM w/o pain or tenderness. No lymphadenopathy.  RESPIRATORY: Chest wall symmetrical. Respirations even and non-labored. Breath sounds clear to auscultation bilaterally.  CARDIAC: S1, S2 present, regular rate and rhythm. Peripheral pulses 2+ bilaterally.  EXTREMITIES: Without clubbing, cyanosis, or edema.  NEUROLOGIC: No motor or sensory deficits. Steady, even gait.  PSYCH/MENTAL STATUS:  Alert, oriented x 3. Cooperative, appropriate mood and affect.   Health Maintenance Due  Topic Date Due   Pneumococcal Vaccine (1 of 2 - PCV) Never done   HPV VACCINES (1 - 3-dose SCDM series) Never done   Mammogram  10/16/2023    No results found for any visits on 01/31/24.  The 10-year ASCVD risk score (Arnett DK, et al., 2019) is: 0.2%     Assessment & Plan:  Assessment and Plan    Generalized anxiety disorder with insomnia Anxiety well-managed with Celexa  and Ambien . Family stressors increased anxiety.  Following sleep hygiene despite lifestyle challenges. - Continue Celexa  20 mg daily. - Continue Ambien  10 mg as needed for insomnia. - Continue magnesium glycinate supplement. - Continue L-theanine supplement. - Continue alprazolam  as needed for acute anxiety.  Attention-deficit hyperactivity disorder, combined type ADHD well-managed with Adderall XR. Improved focus at work. - Continue Adderall XR 20 mg daily.  Mild intermittent asthma Asthma well-controlled with albuterol . No recent exacerbations. - Continue albuterol  inhaler as needed.  Pelvic floor dysfunction with constipation Managed with physical therapy. Constipation possibly worsened by therapy. Considering Milk of Magnesia. Diazepam suppository not used due to  interaction concerns. - Continue pelvic floor physical therapy. - Consider Milk of Magnesia for constipation. - Avoid diazepam suppository unless necessary and monitor for side effects.  Patient is aware to not take diazepam suppositories when taking her Ambien  or alprazolam .  Screening mammogram for breast cancer Screening mammogram due. Prefers breast center of [place]. - Ordered screening mammogram at the breast center of [facility].       Orders Placed This Encounter  Procedures   MM 3D SCREENING MAMMOGRAM BILATERAL BREAST    Standing Status:   Future    Expiration Date:   01/30/2025    Reason for Exam (SYMPTOM  OR DIAGNOSIS REQUIRED):   screening for breast cancer    Preferred imaging [facility]?:   [facility]    Is the patient pregnant?:   No   No images are attached to the encounter or orders placed in the encounter. No orders of the defined types were placed in this encounter.   Return in about 6 months (around 08/01/2024) for Annual Physical Exam with fasting lab work.   Rosina Senters, FNP

## 2024-03-18 DIAGNOSIS — F902 Attention-deficit hyperactivity disorder, combined type: Secondary | ICD-10-CM

## 2024-03-18 NOTE — Telephone Encounter (Signed)
 Pt is requesting refill for amphetamine -dextroamphetamine (ADDERALL XR) 20 MG 24 hr capsule   LOV 01/31/24 FOV 08/05/24 LRF 01/28/24

## 2024-03-21 ENCOUNTER — Ambulatory Visit
Admission: RE | Admit: 2024-03-21 | Discharge: 2024-03-21 | Disposition: A | Source: Ambulatory Visit | Attending: Internal Medicine | Admitting: Internal Medicine

## 2024-03-21 DIAGNOSIS — Z1231 Encounter for screening mammogram for malignant neoplasm of breast: Secondary | ICD-10-CM

## 2024-03-25 ENCOUNTER — Ambulatory Visit: Payer: Self-pay | Admitting: Internal Medicine

## 2024-08-05 ENCOUNTER — Encounter: Admitting: Internal Medicine
